# Patient Record
Sex: Female | Born: 1945 | Race: White | Hispanic: No | Marital: Married
Health system: Southern US, Community
[De-identification: ages and names within clinical notes are randomized; demographics above are authoritative.]

---

## 2005-07-29 ENCOUNTER — Ambulatory Visit: Payer: Self-pay | Admitting: Ophthalmology

## 2006-11-25 ENCOUNTER — Ambulatory Visit: Payer: Self-pay | Admitting: Ophthalmology

## 2006-11-25 ENCOUNTER — Other Ambulatory Visit: Payer: Self-pay

## 2006-12-01 ENCOUNTER — Ambulatory Visit: Payer: Self-pay | Admitting: Ophthalmology

## 2008-03-25 ENCOUNTER — Ambulatory Visit: Payer: Self-pay | Admitting: Family Medicine

## 2008-10-03 ENCOUNTER — Ambulatory Visit: Payer: Self-pay | Admitting: Internal Medicine

## 2009-06-16 ENCOUNTER — Ambulatory Visit: Payer: Self-pay | Admitting: Internal Medicine

## 2009-06-26 ENCOUNTER — Ambulatory Visit: Payer: Self-pay | Admitting: Internal Medicine

## 2010-04-10 ENCOUNTER — Ambulatory Visit: Payer: Self-pay | Admitting: Family Medicine

## 2010-10-11 ENCOUNTER — Ambulatory Visit: Payer: Self-pay | Admitting: Family Medicine

## 2012-04-07 ENCOUNTER — Ambulatory Visit: Payer: Self-pay | Admitting: Family Medicine

## 2012-05-05 ENCOUNTER — Ambulatory Visit: Payer: Self-pay | Admitting: Family Medicine

## 2012-05-06 ENCOUNTER — Ambulatory Visit: Payer: Self-pay | Admitting: Orthopedic Surgery

## 2012-05-06 LAB — URINALYSIS, COMPLETE
Bacteria: NONE SEEN
Bilirubin,UR: NEGATIVE
Blood: NEGATIVE
Glucose,UR: >=500 mg/dL (ref 0–75)
Ketone: NEGATIVE
Leukocyte Esterase: NEGATIVE
Nitrite: NEGATIVE
Ph: 5 (ref 4.5–8.0)
Protein: NEGATIVE
Specific Gravity: 1.022 (ref 1.003–1.030)
WBC UR: NONE SEEN /HPF (ref 0–5)

## 2012-05-06 LAB — CBC
HGB: 14.3 g/dL (ref 12.0–16.0)
MCH: 32.5 pg (ref 26.0–34.0)
Platelet: 271 10*3/uL (ref 150–440)
WBC: 7.6 10*3/uL (ref 3.6–11.0)

## 2012-05-06 LAB — BASIC METABOLIC PANEL
Anion Gap: 8 (ref 7–16)
Calcium, Total: 9.6 mg/dL (ref 8.5–10.1)
Chloride: 107 mmol/L (ref 98–107)
Co2: 27 mmol/L (ref 21–32)
Creatinine: 0.63 mg/dL (ref 0.60–1.30)
Osmolality: 283 (ref 275–301)
Sodium: 142 mmol/L (ref 136–145)

## 2012-05-12 ENCOUNTER — Ambulatory Visit: Payer: Self-pay | Admitting: Internal Medicine

## 2012-05-14 ENCOUNTER — Ambulatory Visit: Payer: Self-pay | Admitting: Orthopedic Surgery

## 2012-07-30 ENCOUNTER — Ambulatory Visit: Payer: Self-pay | Admitting: Family Medicine

## 2012-09-01 ENCOUNTER — Ambulatory Visit: Payer: Self-pay | Admitting: Family Medicine

## 2013-05-28 ENCOUNTER — Ambulatory Visit: Payer: Self-pay | Admitting: Unknown Physician Specialty

## 2013-11-12 ENCOUNTER — Ambulatory Visit: Payer: Self-pay | Admitting: Family Medicine

## 2014-08-12 NOTE — Op Note (Signed)
PATIENT NAME:  Samantha Hunt, Samantha Hunt MR#:  [id number] DATE OF BIRTH:  [birth year]  DATE OF PROCEDURE:  05/14/2012  SURGEON: Murlean Hark, MD  PREOPERATIVE DIAGNOSIS:  Left shoulder rotator cuff tear, subacromial bursitis and impingement.   POSTOPERATIVE DIAGNOSIS:  Left shoulder rotator cuff tear, subacromial bursitis and impingement.  PROCEDURES PERFORMED: 1.  Left shoulder arthroscopy.  2.  Capsular release.  3.  Subacromial decompression/bursectomy.  4.  Rotator cuff repair.   ASSISTANT:  April Berndt  ESTIMATED BLOOD LOSS:  Minimal.  ANESTHESIA:  General anesthesia and interscalene block.  FINDINGS:  Arthroscopy reveals that the patient had a rupture of the proximal biceps tendon. The patient had significant scarring of the anterior capsule. The subscapularis tendon was torn, retracted and severely scarred. The patient had a full thickness tear of the supraspinatus and a portion of the infraspinatus. The patient had significant bursitis in the subacromial space.   COMPLICATIONS:  No immediate intraoperative or postoperative complications were noted.   INDICATIONS FOR PROCEDURE:  The patient is a 69 year old female who presented to my office as an outpatient complaining of chronic pain and weakness in her left shoulder that was severely exacerbated in July after she extended her arm in an attempt to catch her mother-in-law. She has had 3 prior rotator cuff repairs on the contralateral shoulder. The patient states that her pain and dysfunction is quite significant and bothersome, particularly with attempts at doing any overhead activity. MRI was reviewed and extensive discussion was had with the patient and her husband regarding the nature of the surgery and the expected course of rehabilitation. The patient was counseled on risks of surgery, as well as nonoperative options. She was also counseled on the expected course of rehabilitation and the expected period of immobilization  postoperatively. The patient showed full understanding.  DESCRIPTION OF PROCEDURE:  The patient was identified in the preoperative holding area. Her left shoulder was marked as the operative site. H and P was updated.  The patient was brought into the operating room and placed on the operating room table in a supine position. An interscalene block had been administered in the preoperative holding area. General anesthesia was administered. The patient was positioned in a beach chair position with adequate padding of all prominences. Her head was secured in good alignment. Her torso and legs were secured.   The left upper extremity was prepared and draped in the usual sterile fashion. Surgical landmarks were demarcated. Preoperative antibiotics were administered. A surgical timeout was performed identifying the patient, the extremity, the procedure, the images, and confirming the administration of preoperative antibiotics.   A standard posterior viewing portal was made.  Arthroscope was inserted. The patient was found to have significant fraying of the labrum. Her biceps tendon was ruptured and out of the viewable portion of the shoulder joint. The joint was scoped for any loose bodies. The subscapularis tendon was not readily visible in the viewing field. An anterior portal was made under direct visualization and a cannula was inserted. A probe was inserted through the cannula and an attempt was made to locate the subscapularis tendon. The tendon was found to be quite retracted and frayed. Some wispy fibers of tendon were noted at the edge of the glenoid. A tissue grasper was inserted and an attempt was made to reapproximate the tendon back to the humerus. The tendon was tightly scarred. An arthroscopic shaver was inserted and the significant scar in the anterior capsule was released. The subscapularis tendon was freed of surrounding  scar and a portion of the MGHL was released. A bit more excursion of the  subscapularis was noted after the release. Despite extensive scar release, the tendon was still unable to be reapproximated to the humerus. The attainable portion of tendon was extremely thin and friable. Given the chronicity of this portion of the rotator cuff tear and the friable and scarred nature of the tissue, the subscapularis tendon was determined to be irreparable. At this time, the remainder of the labral edge was debrided. Attention was turned to the remainder of the rotator cuff. A large tear of the supraspinatus and a portion of the infraspinatus tendon were noted. With the arthroscope in the glenohumeral joint, the tendon edge was carefully debrided through a lateral portal.   At this time, attention was turned to the subacromial space. With a shaver in a lateral portal, an extensive bursectomy was carried out. Using a combination of an ArthroCare and a burr, a slight acromioplasty was carried out. The CA ligament was left intact.   The rotator cuff tear was located. The anterior cannula was moved into the subacromial space. Using an ArthroCare wand, the humeral footprint was cleaned of soft tissue. A burr was then used to expose bleeding bone. At this time, a 5.5 x 16.3 corkscrew FT-2 Arthrex metal anchor was inserted into the anterior portion of the rotator cuff footprint. This was inserted through the lateral portal. Through a similar approach, a second anchor was placed in the posterior aspect of the footprint. All sutures were stored in the anterior cannula. Using a Scorpion, sutures were passed from anterior to posterior direction. The sutures were subsequently tied using sliding knots from posterior to anterior. The rotator cuff nicely approximated the footprint readily covering over the exposed footprint and anchors. The decision was made to place one lateral row anchor. One suture limb from each pair, as well as a second suture limb from one of the anterior pairs was loaded into a 4.75  BioComposite SwiveLock. After the lateral row hole was punched, the SwiveLock was inserted. This nicely sealed down on the lateral aspect of the torn rotator cuff.   All suture limbs were cut. The shoulder was drained of fluid. The cuff was taken through range of motion to 90 degrees of abduction, 80 degrees of external rotation and internal rotation to neutral and the cuff was found to be intact throughout.   Portals were closed using 3-0 nylon suture. Sterile dressings were applied. A Polar Care was applied. The patient's arm was placed in a sling with a bolster pillow.   The patient will be set up with a TENS unit in the postoperative care unit. She will be discharged home the same day as surgery.    ____________________________ Murlean HarkShalini Jaice Lague, MD sr:ce D: 05/14/2012 14:59:00 ET T: 05/14/2012 17:07:13 ET JOB#: [id number]  cc: Murlean HarkShalini Makenzey Nanni, MD, <Dictator> Murlean HarkSHALINI Beauden Tremont MD ELECTRONICALLY SIGNED 05/20/2012 15:22

## 2014-11-21 ENCOUNTER — Other Ambulatory Visit: Payer: Self-pay | Admitting: Family Medicine

## 2014-11-21 DIAGNOSIS — M5416 Radiculopathy, lumbar region: Secondary | ICD-10-CM

## 2014-11-25 ENCOUNTER — Ambulatory Visit
Admission: RE | Admit: 2014-11-25 | Discharge: 2014-11-25 | Disposition: A | Discharge status: Home / Self Care | Payer: Medicare Other | Source: Ambulatory Visit | Attending: Family Medicine | Admitting: Family Medicine

## 2014-11-25 DIAGNOSIS — M2578 Osteophyte, vertebrae: Secondary | ICD-10-CM | POA: Diagnosis not present

## 2014-11-25 DIAGNOSIS — M4806 Spinal stenosis, lumbar region: Secondary | ICD-10-CM | POA: Diagnosis not present

## 2014-11-25 DIAGNOSIS — G544 Lumbosacral root disorders, not elsewhere classified: Secondary | ICD-10-CM | POA: Diagnosis not present

## 2014-11-25 DIAGNOSIS — M5387 Other specified dorsopathies, lumbosacral region: Secondary | ICD-10-CM | POA: Insufficient documentation

## 2014-11-25 DIAGNOSIS — M5386 Other specified dorsopathies, lumbar region: Secondary | ICD-10-CM | POA: Insufficient documentation

## 2014-11-25 DIAGNOSIS — M5125 Other intervertebral disc displacement, thoracolumbar region: Secondary | ICD-10-CM | POA: Diagnosis not present

## 2014-11-25 DIAGNOSIS — M5416 Radiculopathy, lumbar region: Secondary | ICD-10-CM | POA: Diagnosis present

## 2014-11-25 DIAGNOSIS — M5126 Other intervertebral disc displacement, lumbar region: Secondary | ICD-10-CM | POA: Insufficient documentation

## 2016-04-11 ENCOUNTER — Other Ambulatory Visit: Payer: Self-pay | Admitting: Family Medicine

## 2016-04-11 DIAGNOSIS — Z1231 Encounter for screening mammogram for malignant neoplasm of breast: Secondary | ICD-10-CM

## 2016-05-20 ENCOUNTER — Ambulatory Visit
Admission: RE | Admit: 2016-05-20 | Discharge: 2016-05-20 | Disposition: A | Discharge status: Home / Self Care | Payer: Medicare Other | Source: Ambulatory Visit | Attending: Family Medicine | Admitting: Family Medicine

## 2016-05-20 DIAGNOSIS — Z1231 Encounter for screening mammogram for malignant neoplasm of breast: Secondary | ICD-10-CM | POA: Diagnosis not present

## 2017-04-24 ENCOUNTER — Other Ambulatory Visit: Payer: Self-pay | Admitting: Family Medicine

## 2017-04-24 DIAGNOSIS — Z1231 Encounter for screening mammogram for malignant neoplasm of breast: Secondary | ICD-10-CM

## 2017-05-21 ENCOUNTER — Ambulatory Visit
Admission: RE | Admit: 2017-05-21 | Discharge: 2017-05-21 | Disposition: A | Discharge status: Home / Self Care | Payer: Medicare Other | Source: Ambulatory Visit | Attending: Family Medicine | Admitting: Family Medicine

## 2017-05-21 DIAGNOSIS — Z1231 Encounter for screening mammogram for malignant neoplasm of breast: Secondary | ICD-10-CM

## 2018-04-29 ENCOUNTER — Other Ambulatory Visit: Payer: Self-pay | Admitting: Family Medicine

## 2018-04-29 DIAGNOSIS — Z1231 Encounter for screening mammogram for malignant neoplasm of breast: Secondary | ICD-10-CM

## 2018-05-25 ENCOUNTER — Ambulatory Visit
Admission: RE | Admit: 2018-05-25 | Discharge: 2018-05-25 | Disposition: A | Discharge status: Home / Self Care | Payer: Medicare Other | Source: Ambulatory Visit | Attending: Family Medicine | Admitting: Family Medicine

## 2018-05-25 DIAGNOSIS — Z1231 Encounter for screening mammogram for malignant neoplasm of breast: Secondary | ICD-10-CM | POA: Insufficient documentation

## 2018-06-01 ENCOUNTER — Other Ambulatory Visit: Payer: Self-pay | Admitting: Neurology

## 2018-06-01 DIAGNOSIS — M5442 Lumbago with sciatica, left side: Secondary | ICD-10-CM

## 2018-06-09 ENCOUNTER — Ambulatory Visit: Admission: RE | Admit: 2018-06-09 | Payer: Medicare Other | Source: Ambulatory Visit

## 2018-06-24 ENCOUNTER — Other Ambulatory Visit: Payer: Self-pay | Admitting: Family Medicine

## 2018-06-24 DIAGNOSIS — R1310 Dysphagia, unspecified: Secondary | ICD-10-CM

## 2018-06-30 ENCOUNTER — Other Ambulatory Visit: Payer: Self-pay

## 2018-06-30 ENCOUNTER — Ambulatory Visit
Admission: RE | Admit: 2018-06-30 | Discharge: 2018-06-30 | Disposition: A | Discharge status: Home / Self Care | Payer: Medicare Other | Source: Ambulatory Visit | Attending: Neurology | Admitting: Neurology

## 2018-06-30 DIAGNOSIS — M5442 Lumbago with sciatica, left side: Secondary | ICD-10-CM | POA: Insufficient documentation

## 2018-07-02 ENCOUNTER — Ambulatory Visit: Payer: Medicare Other | Attending: Neurology | Admitting: Physical Therapy

## 2018-07-02 ENCOUNTER — Other Ambulatory Visit: Payer: Self-pay

## 2018-07-02 DIAGNOSIS — M79605 Pain in left leg: Secondary | ICD-10-CM | POA: Insufficient documentation

## 2018-07-02 DIAGNOSIS — R29898 Other symptoms and signs involving the musculoskeletal system: Secondary | ICD-10-CM | POA: Diagnosis present

## 2018-07-02 DIAGNOSIS — M545 Low back pain: Secondary | ICD-10-CM | POA: Insufficient documentation

## 2018-07-02 DIAGNOSIS — M6281 Muscle weakness (generalized): Secondary | ICD-10-CM | POA: Diagnosis present

## 2018-07-02 NOTE — Therapy (Signed)
[facility] [address] [place], [place], [address] Phone: [phone]   Fax:  [phone]  Physical Therapy Evaluation  Patient Details  Name: Samantha Hunt MRN: [id number] Date of Birth: 1945-10-02 No data recorded  Encounter Date: 07/02/2018  PT End of Session - 07/02/18 1055    Visit Number  1    Number of Visits  6    Date for PT Re-Evaluation  07/23/18    Authorization Time Period  Eval 07/02/2018    Authorization - Visit Number  1    Authorization - Number of Visits  6    PT Start Time  0806    PT Stop Time  0900    PT Time Calculation (min)  54 min    Activity Tolerance  Patient tolerated treatment well    Behavior During Therapy  [facility] for tasks assessed/performed       No past medical history on file.  No past surgical history on file.  There were no vitals filed for this visit.  Manual Muscle Testing  UE screening: grip/elbow: WFL Shoulder ROM and strength limited due to bilateral rotator cuff tears  Ankles: Bilat: 4+/5 Knees: flexion/extension: L: 4/5 Hips: Flexion: L: 4-/5, R: 4/5          Abduction: Bilat: 4-/5 *some hesitation with MMT of L side not necessarily limiting strength   Special Tests Abdominal strength: supine leg lower: 3+/5 Thomas L: +, R: - Figure 4: L: +, R: - Bridge: 100% range without pain Unable to perform side plank due to shoulder pain  SI joint provocation tests: - for pain  Joint mobilization: T10-L1: WNL L2-sacrum: hypomobile with pain radiating to L buttock     Subjective Assessment - 07/02/18 1615    Subjective  Patient reports chronic low back pain which has caused radiating sensations down bilateral LE's at times, though she mostly experiences this to the L side.  Pt likes to be active and also takes care of her husband, who has dementia.  She has already been performing some stretches and abdominal exercises which has been helping, but she feels the pain has been  getting worse recently.      Pertinent History  Pt has no significant history involving her back but does report bilateral rotator cuff tears which also limit activity.  She cannot pinpoint a significant event which caused the onset of back pain but does state that is has recently worsened when she is trying to take a walk with her husband in their neighborhood.  Pt reports recently being "cautious" with stairs and that she is able to walk 2 mi but walking in conjunction with standing to shop often causes her back pain to increase.    Limitations  Sitting;Standing;Walking    How long can you stand comfortably?  Pain onset occurs when pt stands still    How long can you walk comfortably?  <2 hrs    Diagnostic tests  MRI    Patient Stated Goals  To be able to do yard work and go shopping without back pain; to be able to play with grandchildren and pick them up.    Currently in Pain?  Yes    Pain Score  7     Pain Location  Hip    Pain Orientation  Left    Pain Descriptors / Indicators  Burning;Radiating;Aching    Pain Type  Chronic pain    Pain Radiating Towards  L hip  Pain Onset  More than a month ago    Pain Frequency  Intermittent    Aggravating Factors   Bending, standing still or walking for prolonged periods of time, lifting, reaching overhead, vaccuuming    Pain Relieving Factors  extension, TENS unit, stretching                    Objective measurements completed on examination: See above findings.              PT Education - 07/02/18 1054    Education Details  HEP issed including stretching and TrA strengthening    Person(s) Educated  Patient    Methods  Explanation;Demonstration;Tactile cues;Handout;Verbal cues    Comprehension  Verbalized understanding;Returned demonstration       PT Short Term Goals - 07/02/18 1643      PT SHORT TERM GOAL #1   Title  Patient will verbalize and demonstrate understanding of HEP to begin to manage symptoms at home.     Baseline  HEP issued today    Time  2    Period  Weeks    Status  New    Target Date  07/16/18      PT SHORT TERM GOAL #2   Title  Pt will present with - figure four and thomas test to demonstrate improved flexion    Baseline  Thomas: + L LE, Figure 4: + L LE    Time  2    Period  Weeks    Status  New    Target Date  07/16/18        PT Long Term Goals - 07/02/18 1639      PT LONG TERM GOAL #1   Title  Patient will improve FOTO score to 61 to indicate improved functional activity.    Baseline  57    Time  3    Period  Weeks    Status  New    Target Date  07/23/18      PT LONG TERM GOAL #2   Title  Patient will demonstrate improved LE strength via MMT: L hip flexion 4+/5, bilateral hip abduction: 4+/5, abdominal strength: 4+/5    Baseline  L hip flexion 4-/5, bilateral hip abduction: 4-/5, abdominal strength: 3+/5    Time  3    Period  Weeks    Status  New    Target Date  07/23/18      PT LONG TERM GOAL #3   Title  Patient will demonstrate independence with HEP to manage pain and symptoms at home.    Baseline  New HEP issued today    Time  3    Period  Weeks    Status  New    Target Date  07/23/18             Plan - 07/02/18 1056    Clinical Impression Statement  Pt is a 73 year old female who is generally active and has been experiencing low back pain which radiates bilaterally, more so L than R.  Pt presented with good overall strength, though she does have weakness of abdominals, hip flexors and abductors and some apprehension with movement of L LE at this time.  Pt presented with hypomobility of sacrum and lumbar vertebrae as well as pain reproduction.  She presented with tightness of piriformis and L hip flexor during special testing.  Pt screened for SI joint dysfunction with negative results.  Pt responded well to HEP  exercises and reported a decrease in pain from 7/10 to 5/10 following evaluation and treatment.  Pt will continue to benefit from skilled PT  with focus on LE and abdominal strength, flexibility, spinal mobility, HEP, pain management and tolerance to activity.    Examination-Activity Limitations  Bend;Caring for Others;Sit;Reach Overhead;Stand    Examination-Participation Restrictions  Laundry;Shop;Yard Work    Agricultural consultant  Low    Rehab Potential  Good    PT Frequency  2x / week    PT Duration  3 weeks    PT Treatment/Interventions  ADLs/Self Care Home Management;Moist Heat;Traction;Gait training;Stair training;Functional mobility training;Therapeutic activities;Therapeutic exercise;Balance training;Neuromuscular re-education;Patient/family education;Manual techniques;Taping;Spinal Manipulations;Joint Manipulations    PT Next Visit Plan  Review HEP, core strengthening, flexibility    Consulted and Agree with Plan of Care  Patient       Patient will benefit from skilled therapeutic intervention in order to improve the following deficits and impairments:  Decreased activity tolerance, Decreased endurance, Decreased mobility, Hypomobility, Pain  Visit Diagnosis: Low back pain radiating to left leg  Muscle weakness (generalized)  Impaired flexibility of lower extremity     Problem List There are no active problems to display for this patient.  Glenetta Hew, PT, DPT  Glenetta Hew 07/02/2018, 4:47 PM  [facility] [address] [place], [place], [address] Phone: [phone]   Fax:  [phone]  Name: Samantha Hunt MRN: [id number] Date of Birth: [birth year]

## 2018-07-07 ENCOUNTER — Other Ambulatory Visit: Payer: Self-pay

## 2018-07-07 ENCOUNTER — Ambulatory Visit: Payer: Medicare Other | Admitting: Physical Therapy

## 2018-07-07 DIAGNOSIS — M545 Low back pain: Secondary | ICD-10-CM

## 2018-07-07 DIAGNOSIS — R29898 Other symptoms and signs involving the musculoskeletal system: Secondary | ICD-10-CM

## 2018-07-07 DIAGNOSIS — M6281 Muscle weakness (generalized): Secondary | ICD-10-CM

## 2018-07-07 DIAGNOSIS — M79605 Pain in left leg: Principal | ICD-10-CM

## 2018-07-07 NOTE — Therapy (Signed)
[facility] [address] [place], [place], [address] Phone: [phone]   Fax:  [phone]  Physical Therapy Treatment  Patient Details  Name: Samantha Hunt MRN: [id number] Date of Birth: Jul 11, 1945 No data recorded  Encounter Date: 07/07/2018  PT End of Session - 07/07/18 1247    Visit Number  2    Number of Visits  6    Date for PT Re-Evaluation  07/23/18    Authorization Time Period  Eval 07/02/2018    Authorization - Visit Number  2    Authorization - Number of Visits  6    PT Start Time  0845    PT Stop Time  0930    PT Time Calculation (min)  45 min    Activity Tolerance  Patient tolerated treatment well    Behavior During Therapy  [facility] for tasks assessed/performed       No past medical history on file.  No past surgical history on file.  There were no vitals filed for this visit.  Subjective Assessment - 07/07/18 0850    Subjective  Pt reports that she is still feeling "burning" in her back.  She has been doing her stretches at home and using heat and ice.  She feels that ice is helping more so than heat.    Pertinent History  Pt has no significant history involving her back but does report bilateral rotator cuff tears which also limit activity.  She cannot pinpoint a significant event which caused the onset of back pain but does state that is has recently worsened when she is trying to take a walk with her husband in their neighborhood.  Pt reports recently being "cautious" with stairs and that she is able to walk 2 mi but walking in conjunction with standing to shop often causes her back pain to increase.    Limitations  Sitting;Standing;Walking    How long can you stand comfortably?  Pain onset occurs when pt stands still    How long can you walk comfortably?  <2 hrs    Diagnostic tests  MRI    Patient Stated Goals  To be able to do yard work and go shopping without back pain; to be able to play with grandchildren and  pick them up.    Pain Onset  More than a month ago      supine lumbar rotation with mild overpressure 3x30 sec Figure four 2x40 sec bilaterally with L side being + for mild pain Supine TrA contraction 3 secx10 Supine 90-90 toe taps x20 VC's for deep breathing Bridge/feet on ball x10 each deadlifts 2x10 with 9 lb wts Squats 2x10 with Vc's for form                           PT Education - 07/07/18 1246    Education Details  Review of HEP, stretching and breathing technique    Person(s) Educated  Patient    Methods  Explanation;Demonstration;Tactile cues;Verbal cues    Comprehension  Verbalized understanding;Need further instruction       PT Short Term Goals - 07/02/18 1643      PT SHORT TERM GOAL #1   Title  Patient will verbalize and demonstrate understanding of HEP to begin to manage symptoms at home.    Baseline  HEP issued today    Time  2    Period  Weeks    Status  New  Target Date  07/16/18      PT SHORT TERM GOAL #2   Title  Pt will present with - figure four and thomas test to demonstrate improved flexion    Baseline  Thomas: + L LE, Figure 4: + L LE    Time  2    Period  Weeks    Status  New    Target Date  07/16/18        PT Long Term Goals - 07/02/18 1639      PT LONG TERM GOAL #1   Title  Patient will improve FOTO score to 61 to indicate improved functional activity.    Baseline  57    Time  3    Period  Weeks    Status  New    Target Date  07/23/18      PT LONG TERM GOAL #2   Title  Patient will demonstrate improved LE strength via MMT: L hip flexion 4+/5, bilateral hip abduction: 4+/5, abdominal strength: 4+/5    Baseline  L hip flexion 4-/5, bilateral hip abduction: 4-/5, abdominal strength: 3+/5    Time  3    Period  Weeks    Status  New    Target Date  07/23/18      PT LONG TERM GOAL #3   Title  Patient will demonstrate independence with HEP to manage pain and symptoms at home.    Baseline  New HEP issued today     Time  3    Period  Weeks    Status  New    Target Date  07/23/18            Plan - 07/07/18 1629    Clinical Impression Statement  Pt entered reporting similar pain to last visit but that ice has been helping.  Pt able to tolerate assistance with stretching without pain increase and reported decrease pain and stiffness following.  Pt presented with good form during strengthening exercises and required min VC's to correct, though she did have some very mild balance disturbance on occasion.  She presented with some tightness of HS on R side today without pain during passive stretch.  Pt will continue to benefit from skilled PT with focus on strength, tolerance to activity, pain management and safe functional mobility.      Examination-Activity Limitations  Bend;Caring for Others;Sit;Reach Overhead;Stand    Examination-Participation Restrictions  Laundry;Shop;Yard Work    Conservation officer, historic buildings  Evolving/Moderate complexity    Rehab Potential  Good    PT Frequency  2x / week    PT Duration  3 weeks    PT Treatment/Interventions  ADLs/Self Care Home Management;Moist Heat;Traction;Gait training;Stair training;Functional mobility training;Therapeutic activities;Therapeutic exercise;Balance training;Neuromuscular re-education;Patient/family education;Manual techniques;Taping;Spinal Manipulations;Joint Manipulations    PT Next Visit Plan  Review HEP, core strengthening, flexibility    Consulted and Agree with Plan of Care  Patient       Patient will benefit from skilled therapeutic intervention in order to improve the following deficits and impairments:  Decreased activity tolerance, Decreased endurance, Decreased mobility, Hypomobility, Pain  Visit Diagnosis: Low back pain radiating to left leg  Muscle weakness (generalized)  Impaired flexibility of lower extremity     Problem List There are no active problems to display for this patient.  Glenetta Hew, PT,  DPT  Glenetta Hew 07/07/2018, 4:31 PM  [place] [facility] [address] [place], [place], [address] Phone: [phone]   Fax:  [phone]  Name: Aubrianna Ishaq MRN: [id number] Date of Birth: 1946/02/05

## 2018-07-24 ENCOUNTER — Ambulatory Visit: Payer: Medicare Other

## 2018-08-31 ENCOUNTER — Ambulatory Visit: Payer: Medicare Other | Attending: Neurology

## 2018-08-31 ENCOUNTER — Other Ambulatory Visit: Payer: Self-pay

## 2018-08-31 DIAGNOSIS — M6281 Muscle weakness (generalized): Secondary | ICD-10-CM

## 2018-08-31 DIAGNOSIS — M545 Low back pain, unspecified: Secondary | ICD-10-CM

## 2018-08-31 DIAGNOSIS — M79605 Pain in left leg: Secondary | ICD-10-CM | POA: Diagnosis present

## 2018-08-31 DIAGNOSIS — R29898 Other symptoms and signs involving the musculoskeletal system: Secondary | ICD-10-CM | POA: Diagnosis present

## 2018-09-02 ENCOUNTER — Other Ambulatory Visit: Payer: Self-pay

## 2018-09-02 ENCOUNTER — Ambulatory Visit: Payer: Medicare Other

## 2018-09-02 ENCOUNTER — Encounter: Payer: Self-pay | Admitting: Physical Therapy

## 2018-09-02 DIAGNOSIS — M79605 Pain in left leg: Secondary | ICD-10-CM

## 2018-09-02 DIAGNOSIS — M545 Low back pain, unspecified: Secondary | ICD-10-CM

## 2018-09-02 DIAGNOSIS — M6281 Muscle weakness (generalized): Secondary | ICD-10-CM

## 2018-09-02 NOTE — Therapy (Signed)
[facility] [address] [place], [place], [address] Phone: [phone]   Fax:  [phone]  Physical Therapy Treatment  Patient Details  Name: Samantha Hunt MRN: [id number] Date of Birth: [birth year] No data recorded  Encounter Date: 09/02/2018  PT End of Session - 09/02/18 0908    Visit Number  4    Number of Visits  15    Date for PT Re-Evaluation  09/28/18    Authorization Time Period  Eval 07/02/2018    PT Start Time  0900    PT Stop Time  0950    PT Time Calculation (min)  50 min    Activity Tolerance  Patient tolerated treatment well    Behavior During Therapy  [facility] for tasks assessed/performed       History reviewed. No pertinent past medical history.  History reviewed. No pertinent surgical history.  There were no vitals filed for this visit.  Subjective Assessment - 09/02/18 0907    Subjective  Pt reports that she is having some low back stiffness upon arrival today. Denies pain but reports stiffness of 8/10. She was not excessively sore after her last appointment. No specific questions or concerns at this time.     Pertinent History  Pt has no significant history involving her back but does report bilateral rotator cuff tears which also limit activity.  She cannot pinpoint a significant event which caused the onset of back pain but does state that is has recently worsened when she is trying to take a walk with her husband in their neighborhood.  Pt reports recently being "cautious" with stairs and that she is able to walk 2 mi but walking in conjunction with standing to shop often causes her back pain to increase.    Limitations  Sitting;Standing;Walking    How long can you stand comfortably?  Pain onset occurs when pt stands still    How long can you walk comfortably?  <2 hrs    Diagnostic tests  MRI    Patient Stated Goals  To be able to do yard work and go shopping without back pain; to be able to play with  grandchildren and pick them up.    Currently in Pain?  Yes    Pain Score  8     Pain Location  Back    Pain Orientation  Left    Pain Descriptors / Indicators  Tightness    Pain Type  Chronic pain    Pain Onset  More than a month ago          TREATMENT   Manual Therapy  Hooklying lumbar rotationrocking within pain-free range for warm-up; Figure four stretch 30s hold x 2 bilateral; Cross-body piriformis stretch 30s hold x 2 bilateral; Single knee to chest stretch 30s hold x 2 bilateral; HS stretch with sciatic nerve glides 30s x 2 bilateral; Hooklying lumbar rotational stretch with overpressure by therapist 30s x 2 bilateral; Long axis L hip/lumbar distraction with belt assist, grade II, 30s/bout x 3 bouts;   Ther-ex  SciFit L4 x 10 minutes for warm-up (unbilled); Hooklying ant/post pelvic tilts 3s hold 2 x 10 each direction; Hooklying marches with coordinated inhale/exhale for TrA contraction alternating 2 x 10 bilateral; Hooklying bridge with 3s hold  with coordinated inhale/exhale for TrA contraction x 10;   Pt educated throughout session about proper posture and technique with exercises. Improved exercise technique, movement at target joints, use of target muscles after min to mod  verbal, visual, tactile cues.     Pt is able to complete all exercises as instructed today. Updated goals with patient due to EMR being down during last appointment. She demonstrates improved hip abduction strength today but hip flexion and abdominal strength is unchanged. She reports decreased "tightness" in her low back at end of session but does not rate. Pt encouraged to continue her HEP. Will continue to progress lumbar mobility and abdominal/low back strength. Pt will benefit from PT services to address deficits in strength and pain in order to return to full function at home.                         PT Short Term Goals - 09/02/18 0944      PT SHORT TERM GOAL #1    Title  Patient will verbalize and demonstrate understanding of HEP to begin to manage symptoms at home.    Baseline  HEP issued today    Time  2    Period  Weeks    Status  On-going    Target Date  09/14/18      PT SHORT TERM GOAL #2   Title  Pt will present with - figure four and thomas test to demonstrate improved flexion    Baseline  Thomas: + L LE, Figure 4: + L LE; 09/02/18: Negative figure 4, Thomas: negative for tissue length restrictions, positive for left low back pain;    Time  2    Period  Weeks    Status  Partially Met    Target Date  09/14/18        PT Long Term Goals - 09/02/18 0947      PT LONG TERM GOAL #1   Title  Patient will improve FOTO score to 61 to indicate improved functional activity.    Baseline  57    Time  4    Period  Weeks    Status  Deferred    Target Date  09/28/18      PT LONG TERM GOAL #2   Title  Patient will demonstrate improved LE strength via MMT: L hip flexion 4+/5, bilateral hip abduction: 4+/5, abdominal strength: 4+/5    Baseline  L hip flexion 4-/5, bilateral hip abduction: 4-/5, abdominal strength: 3+/5; 09/02/18: L hip flexion 4-/5, bilateral hip abduction: 4/5, abdominal strength: 3+/5;    Time  4    Period  Weeks    Status  Deferred      PT LONG TERM GOAL #3   Title  Patient will demonstrate independence with HEP to manage pain and symptoms at home.    Baseline  New HEP issued today    Time  4    Period  Weeks    Status  On-going    Target Date  09/28/18            Plan - 09/02/18 0909    Clinical Impression Statement  Pt is able to complete all exercises as instructed today. Updated goals with patient due to EMR being down during last appointment. She demonstrates improved hip abduction strength today but hip flexion and abdominal strength is unchanged. She reports decreased "tightness" in her low back at end of session but does not rate. Pt encouraged to continue her HEP. Will continue to progress lumbar mobility and  abdominal/low back strength. Pt will benefit from PT services to address deficits in strength and pain in order to return to full function  at home.     Examination-Activity Limitations  Bend;Caring for Others;Sit;Reach Overhead;Stand    Examination-Participation Restrictions  Laundry;Shop;Yard Work    [employer]chant [employer] officer  Evolving/Moderate complexity    Rehab Potential  Good    PT Frequency  2x / week    PT Duration  3 weeks    PT Treatment/Interventions  ADLs/Self Care Home Management;Moist Heat;Traction;Gait training;Stair training;Functional mobility training;Therapeutic activities;Therapeutic exercise;Balance training;Neuromuscular re-education;Patient/family education;Manual techniques;Taping;Spinal Manipulations;Joint Manipulations    PT Next Visit Plan  Review HEP, core strengthening, flexibility    Consulted and Agree with Plan of Care  Patient       Patient will benefit from skilled therapeutic intervention in order to improve the following deficits and impairments:  Decreased activity tolerance, Decreased endurance, Decreased mobility, Hypomobility, Pain  Visit Diagnosis: Low back pain radiating to left leg  Muscle weakness (generalized)     Problem List There are no active problems to display for this patient.  Lyndel Safe Kyelle Urbas PT, DPT, GCS  Bertran Zeimet 09/02/2018, 11:09 AM  [facility] [address]. [place], [place], [address] Phone: [phone]   Fax:  [phone]  Name: Samantha Hunt MRN: [id number] Date of Birth: [birth year]

## 2018-09-02 NOTE — Therapy (Signed)
[facility] [address] [place], [place], [address] Phone: [phone]   Fax:  [phone]  Physical Therapy Treatment/Recertification  Patient Details  Name: Samantha Hunt MRN: [id number] Date of Birth: Aug 22, 1945 No data recorded  Encounter Date: 08/31/2018  PT End of Session - 09/02/18 0848    Visit Number  3    Number of Visits  15    Date for PT Re-Evaluation  09/28/18    Authorization Time Period  Eval 07/02/2018    PT Start Time  1315    PT Stop Time  1400    PT Time Calculation (min)  45 min    Activity Tolerance  Patient tolerated treatment well    Behavior During Therapy  [facility] for tasks assessed/performed       History reviewed. No pertinent past medical history.  History reviewed. No pertinent surgical history.  There were no vitals filed for this visit.  Subjective Assessment - 09/02/18 0847    Subjective  Pt reports that she is doing alright today but her back is still hurting. She complains of 6/10 low back pain upon arrival. No specific questions upon arrival.     Pertinent History  Pt has no significant history involving her back but does report bilateral rotator cuff tears which also limit activity.  She cannot pinpoint a significant event which caused the onset of back pain but does state that is has recently worsened when she is trying to take a walk with her husband in their neighborhood.  Pt reports recently being "cautious" with stairs and that she is able to walk 2 mi but walking in conjunction with standing to shop often causes her back pain to increase.    Limitations  Sitting;Standing;Walking    How long can you stand comfortably?  Pain onset occurs when pt stands still    How long can you walk comfortably?  <2 hrs    Diagnostic tests  MRI    Patient Stated Goals  To be able to do yard work and go shopping without back pain; to be able to play with grandchildren and pick them up.    Currently in  Pain?  Yes    Pain Score  6     Pain Location  Back    Pain Orientation  Left    Pain Descriptors / Indicators  Burning;Aching;Radiating    Pain Type  Chronic pain    Pain Onset  More than a month ago         TREATMENT   Manual Therapy  Hooklying lumbar rotation rocking within pain-free range for warm-up; Figure four stretch 30s hold x 2 bilateral; Cross-body piriformis stretch on L 30s hold x 2; L single knee to chest stretch 30s hold x 2; Lumbar rotational mobilizations grade I-II 30s/bout x 3 bouts bilateral; Prone CPA and L UPA grade II, 30s/bout x 3 bouts L3-L5;   Ther-ex  Hooklying ant/post pelvic tilts 3s hold 2 x 10 each direction; Hooklying bridge with 3s hold x 10;   Pt educated throughout session about proper posture and technique with exercises. Improved exercise technique, movement at target joints, use of target muscles after min to mod verbal, visual, tactile cues.     Pt is able to complete all exercises as instructed today. Epic EMR system is down during visit so unable to update goals with patient but will complete at next session. Pt encouraged to continue her HEP. Will continue to progress lumbar  mobility and abdominal/low back strength. Pt will benefit from PT services to address deficits in strength and pain in order to return to full function at home.                       PT Short Term Goals - 09/02/18 0850      PT SHORT TERM GOAL #1   Title  Patient will verbalize and demonstrate understanding of HEP to begin to manage symptoms at home.    Baseline  HEP issued today    Time  2    Period  Weeks    Status  On-going    Target Date  09/14/18      PT SHORT TERM GOAL #2   Title  Pt will present with - figure four and thomas test to demonstrate improved flexion    Baseline  Thomas: + L LE, Figure 4: + L LE    Time  2    Period  Weeks    Status  Deferred    Target Date  09/14/18        PT Long Term Goals - 09/02/18 0853       PT LONG TERM GOAL #1   Title  Patient will improve FOTO score to 61 to indicate improved functional activity.    Baseline  57    Time  4    Period  Weeks    Status  Deferred    Target Date  09/30/18      PT LONG TERM GOAL #2   Title  Patient will demonstrate improved LE strength via MMT: L hip flexion 4+/5, bilateral hip abduction: 4+/5, abdominal strength: 4+/5    Baseline  L hip flexion 4-/5, bilateral hip abduction: 4-/5, abdominal strength: 3+/5    Time  4    Period  Weeks    Status  Deferred    Target Date  09/28/18      PT LONG TERM GOAL #3   Title  Patient will demonstrate independence with HEP to manage pain and symptoms at home.    Baseline  New HEP issued today    Time  4    Period  Weeks    Status  On-going            Plan - 09/02/18 0849    Clinical Impression Statement  Pt is able to complete all exercises as instructed today. Epic EMR system is down during visit so unable to update goals with patient but will complete at next session. Pt encouraged to continue her HEP. Will continue to progress lumbar mobility and abdominal/low back strength. Pt will benefit from PT services to address deficits in strength and pain in order to return to full function at home.     Examination-Activity Limitations  Bend;Caring for Others;Sit;Reach Overhead;Stand    Examination-Participation Restrictions  Laundry;Shop;Yard Work    Conservation officer, historic buildings  Evolving/Moderate complexity    Rehab Potential  Good    PT Frequency  2x / week    PT Duration  3 weeks    PT Treatment/Interventions  ADLs/Self Care Home Management;Moist Heat;Traction;Gait training;Stair training;Functional mobility training;Therapeutic activities;Therapeutic exercise;Balance training;Neuromuscular re-education;Patient/family education;Manual techniques;Taping;Spinal Manipulations;Joint Manipulations    PT Next Visit Plan  Review HEP, core strengthening, flexibility    Consulted and Agree with Plan of  Care  Patient       Patient will benefit from skilled therapeutic intervention in order to improve the following deficits and impairments:  Decreased  activity tolerance, Decreased endurance, Decreased mobility, Hypomobility, Pain  Visit Diagnosis: Low back pain radiating to left leg  Muscle weakness (generalized)     Problem List There are no active problems to display for this patient.  Lynnea Maizes PT, DPT, GCS  Huprich,Jason 09/02/2018, 9:04 AM  [facility] MAIN [facility] SERVICES [address] [place], [place], [address] Phone: [phone]   Fax:  [phone]  Name: Samantha Hunt MRN: [id number] Date of Birth: 02-10-1946

## 2018-09-07 ENCOUNTER — Other Ambulatory Visit: Payer: Self-pay

## 2018-09-07 ENCOUNTER — Encounter: Payer: Self-pay | Admitting: Physical Therapy

## 2018-09-07 ENCOUNTER — Ambulatory Visit: Payer: Medicare Other

## 2018-09-07 DIAGNOSIS — M545 Low back pain: Secondary | ICD-10-CM

## 2018-09-07 DIAGNOSIS — M6281 Muscle weakness (generalized): Secondary | ICD-10-CM

## 2018-09-07 DIAGNOSIS — M79605 Pain in left leg: Secondary | ICD-10-CM

## 2018-09-07 NOTE — Therapy (Signed)
[facility] [address] [place], [place], [address] Phone: [phone]   Fax:  [phone]  Physical Therapy Treatment  Patient Details  Name: Samantha Hunt MRN: [id number] Date of Birth: Oct 31, 1945 No data recorded  Encounter Date: 09/07/2018  PT End of Session - 09/07/18 1406    Visit Number  5    Number of Visits  15    Date for PT Re-Evaluation  09/28/18    Authorization Time Period  Eval 07/02/2018    PT Start Time  1025    PT Stop Time  1448    PT Time Calculation (min)  45 min    Activity Tolerance  Patient tolerated treatment well    Behavior During Therapy  [facility] for tasks assessed/performed       History reviewed. No pertinent past medical history.  History reviewed. No pertinent surgical history.  There were no vitals filed for this visit.  Subjective Assessment - 09/07/18 1402    Subjective  Pt reports that she is having some low back stiffness upon arrival today. She rates it as 6/10 upon arrival. Pt reports that she just finished strawberry picking which did aggravate her back slightly. Overall she feels like her back pain is improving. No specific questions or concerns at this time.    Pertinent History  Pt has no significant history involving her back but does report bilateral rotator cuff tears which also limit activity.  She cannot pinpoint a significant event which caused the onset of back pain but does state that is has recently worsened when she is trying to take a walk with her husband in their neighborhood.  Pt reports recently being "cautious" with stairs and that she is able to walk 2 mi but walking in conjunction with standing to shop often causes her back pain to increase.    Limitations  Sitting;Standing;Walking    How long can you stand comfortably?  Pain onset occurs when pt stands still    How long can you walk comfortably?  <2 hrs    Diagnostic tests  MRI    Patient Stated Goals  To be able to do  yard work and go shopping without back pain; to be able to play with grandchildren and pick them up.    Currently in Pain?  Yes    Pain Score  6     Pain Location  Back    Pain Orientation  Left    Pain Descriptors / Indicators  Tightness    Pain Type  Chronic pain    Pain Onset  More than a month ago    Pain Frequency  Intermittent          TREATMENT   Manual Therapy Hooklyinglumbar rotationrocking within pain-free range for warm-up; Figure fourstretch 30s hold x 2 bilateral; Cross-body piriformis stretch 30s hold x 2 bilateral; Single knee to chest stretch 30s hold x 2 bilateral; HS stretch with sciatic nerve glides 30s x 2 bilateral; Long axis L hip/lumbar distraction with belt assist, grade II, 30s/bout x 3 bouts; Prone central posterior to anterior mobilizations grade I-II, L3-L5, 30s/bout x 1 bout each; Prone L unilateral posterior to anterior mobilizations grade I-II, L3-L5, 30s/bout x 1 bout each;   Ther-ex NuStep warm-up during history (3 minutes unbilled); Hooklying ant/post pelvic tilts 3s hold 2 x 10 each direction; Hooklying marches with coordinated inhale/exhale for TrA contraction alternating 2 x 10 bilateral; Hooklying bridge with 3s hold  with coordinated inhale/exhale for  TrA contraction x 10; Hooklying clams with manual resistance x 10; Hooklying adductor squeeze with manual resistance x 10;   Pt educated throughout session about proper posture and technique with exercises. Improved exercise technique, movement at target joints, use of target muscles after min to mod verbal, visual, tactile cues.   Pt is able to complete all exercises as instructed today. She reports that overall she feels like her pain is improving. She denies any improvement in tightness at end of session. Pt continues to reinforce her position of comfort is lumbar extension. Pt encouraged to continue her HEP. Will continue to progress lumbar mobility and abdominal/low back  strength.Pt will benefit from PT services to address deficits in strengthand painin order to return to full function at home.                       PT Short Term Goals - 09/02/18 0944      PT SHORT TERM GOAL #1   Title  Patient will verbalize and demonstrate understanding of HEP to begin to manage symptoms at home.    Baseline  HEP issued today    Time  2    Period  Weeks    Status  On-going    Target Date  09/14/18      PT SHORT TERM GOAL #2   Title  Pt will present with - figure four and thomas test to demonstrate improved flexion    Baseline  Thomas: + L LE, Figure 4: + L LE; 09/02/18: Negative figure 4, Thomas: negative for tissue length restrictions, positive for left low back pain;    Time  2    Period  Weeks    Status  Partially Met    Target Date  09/14/18        PT Long Term Goals - 09/02/18 0947      PT LONG TERM GOAL #1   Title  Patient will improve FOTO score to 61 to indicate improved functional activity.    Baseline  57    Time  4    Period  Weeks    Status  Deferred    Target Date  09/28/18      PT LONG TERM GOAL #2   Title  Patient will demonstrate improved LE strength via MMT: L hip flexion 4+/5, bilateral hip abduction: 4+/5, abdominal strength: 4+/5    Baseline  L hip flexion 4-/5, bilateral hip abduction: 4-/5, abdominal strength: 3+/5; 09/02/18: L hip flexion 4-/5, bilateral hip abduction: 4/5, abdominal strength: 3+/5;    Time  4    Period  Weeks    Status  Deferred      PT LONG TERM GOAL #3   Title  Patient will demonstrate independence with HEP to manage pain and symptoms at home.    Baseline  New HEP issued today    Time  4    Period  Weeks    Status  On-going    Target Date  09/28/18            Plan - 09/07/18 1407    Clinical Impression Statement  Pt is able to complete all exercises as instructed today. She reports that overall she feels like her pain is improving. She denies any improvement in tightness at end  of session. Pt continues to reinforce her position of comfort is lumbar extension. Pt encouraged to continue her HEP. Will continue to progress lumbar mobility and abdominal/low back strength.Pt will benefit  from PT services to address deficits in strengthand painin order to return to full function at home.    Examination-Activity Limitations  Bend;Caring for Others;Sit;Reach Overhead;Stand    Examination-Participation Restrictions  Laundry;Shop;Yard Work    Merchant [employer] officer  Evolving/Moderate complexity    Rehab Potential  Good    PT Frequency  2x / week    PT Duration  3 weeks    PT Treatment/Interventions  ADLs/Self Care Home Management;Moist Heat;Traction;Gait training;Stair training;Functional mobility training;Therapeutic activities;Therapeutic exercise;Balance training;Neuromuscular re-education;Patient/family education;Manual techniques;Taping;Spinal Manipulations;Joint Manipulations    PT Next Visit Plan  Review HEP, core strengthening, flexibility    Consulted and Agree with Plan of Care  Patient       Patient will benefit from skilled therapeutic intervention in order to improve the following deficits and impairments:  Decreased activity tolerance, Decreased endurance, Decreased mobility, Hypomobility, Pain  Visit Diagnosis: Low back pain radiating to left leg  Muscle weakness (generalized)     Problem List There are no active problems to display for this patient.  Lyndel Safe Mattis Featherly PT, DPT, GCS  Mareena Cavan 09/07/2018, 2:48 PM  [place] [address] [place], [place], [address] Phone: [phone]   Fax:  [phone]  Name: Iyauna Sing MRN: [id number] Date of Birth: May 15, 1945

## 2018-09-09 ENCOUNTER — Other Ambulatory Visit: Payer: Self-pay

## 2018-09-09 ENCOUNTER — Ambulatory Visit: Payer: Medicare Other

## 2018-09-09 ENCOUNTER — Encounter: Payer: Self-pay | Admitting: Physical Therapy

## 2018-09-09 DIAGNOSIS — M545 Low back pain, unspecified: Secondary | ICD-10-CM

## 2018-09-09 DIAGNOSIS — M6281 Muscle weakness (generalized): Secondary | ICD-10-CM

## 2018-09-09 NOTE — Therapy (Signed)
[facility] [address] [place], [place], [address] Phone: [phone]   Fax:  [phone]  Physical Therapy Treatment  Patient Details  Name: Samantha Hunt MRN: [id number] Date of Birth: 1945/10/30 No data recorded  Encounter Date: 09/09/2018  PT End of Session - 09/09/18 0906    Visit Number  6    Number of Visits  15    Date for PT Re-Evaluation  09/28/18    Authorization Time Period  Eval 07/02/2018    PT Start Time  0858    PT Stop Time  0945    PT Time Calculation (min)  47 min    Activity Tolerance  Patient tolerated treatment well    Behavior During Therapy  [facility] for tasks assessed/performed       History reviewed. No pertinent past medical history.  History reviewed. No pertinent surgical history.  There were no vitals filed for this visit.  Subjective Assessment - 09/09/18 0905    Subjective  Pt reports that she is having some low back stiffness upon arrival today. She rates it as 6/10 upon arrival. No specific questions or concerns at this time.    Pertinent History  Pt has no significant history involving her back but does report bilateral rotator cuff tears which also limit activity.  She cannot pinpoint a significant event which caused the onset of back pain but does state that is has recently worsened when she is trying to take a walk with her husband in their neighborhood.  Pt reports recently being "cautious" with stairs and that she is able to walk 2 mi but walking in conjunction with standing to shop often causes her back pain to increase.    Limitations  Sitting;Standing;Walking    How long can you stand comfortably?  Pain onset occurs when pt stands still    How long can you walk comfortably?  <2 hrs    Diagnostic tests  MRI    Patient Stated Goals  To be able to do yard work and go shopping without back pain; to be able to play with grandchildren and pick them up.    Currently in Pain?  Yes    Pain Score  6      Pain Location  Back    Pain Orientation  Left    Pain Descriptors / Indicators  Tightness    Pain Type  Chronic pain    Pain Onset  More than a month ago          TREATMENT   Manual Therapy Hooklyinglumbar rotationrocking within pain-free range for warm-up; L figure fourstretch 30s hold x 2 bilateral; L cross-body piriformis stretch 30s hold x 2bilateral; L single knee to chest stretch 30s hold x 2bilateral; L HS stretch with sciatic nerve glides x 30s;   Ther-ex NuStep warm-up during history (3 minutes unbilled); Hooklying ant/post pelvic tilts 3s hold 2 x 10 each direction; Hooklying marches with coordinated inhale/exhale for TrA contraction alternating 2 x 10 bilateral; Hooklying bridge with 3s holdwith coordinated inhale/exhale for TrA contraction 2 x 10; Hooklying pball press down for abdominal strength 3s hold 2 x 10; Hooklying resisted lumbar rotation for oblique activation with therapist providing resistance at knees 3s hold 2 x 10 each direction; Hooklying clams with red tband resistance 2 x 10; Hooklying adductor ball squeeze 3s hold x 10; Prone alternating hip extension 2 x 10 bilateral;   Pt educated throughout session about proper posture and technique with exercises.  Improved exercise technique, movement at target joints, use of target muscles after min to mod verbal, visual, tactile cues.   Pt is able to complete all exercises as instructed today. Focused session more on strengthening as she reports poor carryover from session to session with respect to pain relief. Therapist attempting to empower pt with more exercises she can complete independently at home. Written HEP furnished to patient today. Pt encouraged to continue her HEP. Will continue to progress lumbar mobility and abdominal/low back strength.Pt will benefit from PT services to address deficits in strengthand painin order to return to full function at  home.                       PT Short Term Goals - 09/02/18 0944      PT SHORT TERM GOAL #1   Title  Patient will verbalize and demonstrate understanding of HEP to begin to manage symptoms at home.    Baseline  HEP issued today    Time  2    Period  Weeks    Status  On-going    Target Date  09/14/18      PT SHORT TERM GOAL #2   Title  Pt will present with - figure four and thomas test to demonstrate improved flexion    Baseline  Thomas: + L LE, Figure 4: + L LE; 09/02/18: Negative figure 4, Thomas: negative for tissue length restrictions, positive for left low back pain;    Time  2    Period  Weeks    Status  Partially Met    Target Date  09/14/18        PT Long Term Goals - 09/02/18 0947      PT LONG TERM GOAL #1   Title  Patient will improve FOTO score to 61 to indicate improved functional activity.    Baseline  57    Time  4    Period  Weeks    Status  Deferred    Target Date  09/28/18      PT LONG TERM GOAL #2   Title  Patient will demonstrate improved LE strength via MMT: L hip flexion 4+/5, bilateral hip abduction: 4+/5, abdominal strength: 4+/5    Baseline  L hip flexion 4-/5, bilateral hip abduction: 4-/5, abdominal strength: 3+/5; 09/02/18: L hip flexion 4-/5, bilateral hip abduction: 4/5, abdominal strength: 3+/5;    Time  4    Period  Weeks    Status  Deferred      PT LONG TERM GOAL #3   Title  Patient will demonstrate independence with HEP to manage pain and symptoms at home.    Baseline  New HEP issued today    Time  4    Period  Weeks    Status  On-going    Target Date  09/28/18            Plan - 09/09/18 0906    Clinical Impression Statement  Pt is able to complete all exercises as instructed today. Focused session more on strengthening as she reports poor carryover from session to session with respect to pain relief. Therapist attempting to empower pt with more exercises she can complete independently at home. Written HEP  furnished to patient today. Pt encouraged to continue her HEP. Will continue to progress lumbar mobility and abdominal/low back strength.Pt will benefit from PT services to address deficits in strengthand painin order to return to full function at home.  Examination-Activity Limitations  Bend;Caring for Others;Sit;Reach Overhead;Stand    Examination-Participation Restrictions  Laundry;Shop;Yard Work    [employer] officer  Evolving/Moderate complexity    Rehab Potential  Good    PT Frequency  2x / week    PT Duration  3 weeks    PT Treatment/Interventions  ADLs/Self Care Home Management;Moist Heat;Traction;Gait training;Stair training;Functional mobility training;Therapeutic activities;Therapeutic exercise;Balance training;Neuromuscular re-education;Patient/family education;Manual techniques;Taping;Spinal Manipulations;Joint Manipulations    PT Next Visit Plan  Review HEP, core strengthening, flexibility    PT Home Exercise Plan  Medbridge: [email or web address]     Consulted and Agree with Plan of Care  Patient       Patient will benefit from skilled therapeutic intervention in order to improve the following deficits and impairments:  Decreased activity tolerance, Decreased endurance, Decreased mobility, Hypomobility, Pain  Visit Diagnosis: Low back pain radiating to left leg  Muscle weakness (generalized)     Problem List There are no active problems to display for this patient.  Phillips Grout PT, DPT, GCS  Arlow Spiers 09/09/2018, 10:35 AM  [place] [facility] [address] [place], [place], [address] Phone: [phone]   Fax:  [phone]  Name: Maribell Demeo MRN: [id number] Date of Birth: 05/31/1945

## 2018-09-09 NOTE — Patient Instructions (Signed)
Access Code: [email or web address]  URL: [email or web address]  Date: 09/09/2018  Prepared by: Ria Comment   Exercises  Supine Figure 4 Piriformis Stretch - 3 reps - 30 seconds hold - 1x daily - 7x weekly  Supine Piriformis Stretch with Foot on Ground - 3 reps - 30 seconds hold - 1x daily - 7x weekly  Supine Pelvic Tilt - 10 reps - 2 sets - 1x daily - 7x weekly  Supine Bridge - 10 reps - 2 sets - 1x daily - 7x weekly  Supine March - 10 reps - 2 sets - 3 seconds hold - 1x daily - 7x weekly

## 2018-09-15 ENCOUNTER — Ambulatory Visit: Payer: Medicare Other | Admitting: Physical Therapy

## 2018-09-17 ENCOUNTER — Ambulatory Visit: Payer: Medicare Other | Admitting: Physical Therapy

## 2018-09-17 ENCOUNTER — Other Ambulatory Visit: Payer: Self-pay

## 2018-09-17 ENCOUNTER — Encounter: Payer: Self-pay | Admitting: Physical Therapy

## 2018-09-17 DIAGNOSIS — M6281 Muscle weakness (generalized): Secondary | ICD-10-CM

## 2018-09-17 DIAGNOSIS — M79605 Pain in left leg: Secondary | ICD-10-CM

## 2018-09-17 DIAGNOSIS — R29898 Other symptoms and signs involving the musculoskeletal system: Secondary | ICD-10-CM

## 2018-09-17 DIAGNOSIS — M545 Low back pain: Secondary | ICD-10-CM

## 2018-09-17 NOTE — Therapy (Signed)
[facility] [address] [place], [place], [address] Phone: [phone]   Fax:  [phone]  Physical Therapy Treatment  Patient Details  Name: Samantha Hunt MRN: [id number] Date of Birth: 03/02/1946 No data recorded  Encounter Date: 09/17/2018  PT End of Session - 09/17/18 0902    Visit Number  7    Number of Visits  15    Date for PT Re-Evaluation  09/28/18    Authorization Time Period  Eval 07/02/2018    PT Start Time  0902    PT Stop Time  0943    PT Time Calculation (min)  41 min    Activity Tolerance  Patient tolerated treatment well    Behavior During Therapy  [facility] for tasks assessed/performed       History reviewed. No pertinent past medical history.  History reviewed. No pertinent surgical history.  There were no vitals filed for this visit.  Subjective Assessment - 09/17/18 0904    Subjective  Patient states that she is "pretty good" but still has some lumbar tightness with radiation into the legs. Patient reports feeling like she "pays for it" when she is picking up sticks in the yard.    Pertinent History  Pt has no significant history involving her back but does report bilateral rotator cuff tears which also limit activity.  She cannot pinpoint a significant event which caused the onset of back pain but does state that is has recently worsened when she is trying to take a walk with her husband in their neighborhood.  Pt reports recently being "cautious" with stairs and that she is able to walk 2 mi but walking in conjunction with standing to shop often causes her back pain to increase.    Limitations  Sitting;Standing;Walking    How long can you stand comfortably?  Pain onset occurs when pt stands still    How long can you walk comfortably?  <2 hrs    Diagnostic tests  MRI    Patient Stated Goals  To be able to do yard work and go shopping without back pain; to be able to play with grandchildren and pick them up.    Currently in Pain?  Yes    Pain Score  6     Pain Location  Back    Pain Orientation  Left    Pain Descriptors / Indicators  Tightness    Pain Type  Chronic pain    Pain Onset  More than a month ago    Pain Frequency  Intermittent      TREATMENT  Manual Therapy Hooklyinglumbar rotationrocking within pain-free range for improved spinal mobility; figure fourstretch 30s hold x 2 bilateral; cross-body piriformis stretch 30s hold x 2bilateral; single knee to chest stretch 30s hold x 2bilateral; HS stretch with sciatic nerve glides x 30s;   Ther-ex NuStep warm-up x 5 min for endogenous pain relief; Hooklying ant/post pelvic tilts 3s hold 2 x 10 each direction; Hooklying marches with coordinated inhale/exhale for TrA contraction alternating 2 x 10 bilateral; Hooklying bridge with 3s holdwith coordinated inhale/exhale for TrA contraction 2 x 10; Hooklying pball press down for abdominal strength 3s hold 2 x 10; Hooklying clams with red tband resistance 2 x 10; Hooklying adductor ball squeeze 3s hold x 10; Standing hip extension 2 x 10 bilateral;    Patient educated throughout session on appropriate technique and form using multi-modal cueing, HEP, and activity modification. Patient articulated understanding and returned demonstration.  Patient Response to interventions: Patient denied any increase in pain and reported improved confidence with bending over to pick things up in the yard.  ASSESSMENT Patient presents to clinic with excellent motivation to participate in therapy. Patient demonstrates deficits in spinal mobility, activity tolerance, and trunk strength as evidenced by increased rotational accessory motion with unilateral extremity movement, fear of bending forward. Patient able to achieve isolated hip extension without lumbar extension compensation during today's session and responded positively to active interventions. Patient will benefit from continued skilled  therapeutic intervention to address remaining deficits in spinal mobility, activity tolerance, and trunk strength in order to return to full participation in social activities, increase function, and improve overall QOL.         PT Short Term Goals - 09/02/18 0944      PT SHORT TERM GOAL #1   Title  Patient will verbalize and demonstrate understanding of HEP to begin to manage symptoms at home.    Baseline  HEP issued today    Time  2    Period  Weeks    Status  On-going    Target Date  09/14/18      PT SHORT TERM GOAL #2   Title  Pt will present with - figure four and thomas test to demonstrate improved flexion    Baseline  Thomas: + L LE, Figure 4: + L LE; 09/02/18: Negative figure 4, Thomas: negative for tissue length restrictions, positive for left low back pain;    Time  2    Period  Weeks    Status  Partially Met    Target Date  09/14/18        PT Long Term Goals - 09/02/18 0947      PT LONG TERM GOAL #1   Title  Patient will improve FOTO score to 61 to indicate improved functional activity.    Baseline  57    Time  4    Period  Weeks    Status  Deferred    Target Date  09/28/18      PT LONG TERM GOAL #2   Title  Patient will demonstrate improved LE strength via MMT: L hip flexion 4+/5, bilateral hip abduction: 4+/5, abdominal strength: 4+/5    Baseline  L hip flexion 4-/5, bilateral hip abduction: 4-/5, abdominal strength: 3+/5; 09/02/18: L hip flexion 4-/5, bilateral hip abduction: 4/5, abdominal strength: 3+/5;    Time  4    Period  Weeks    Status  Deferred      PT LONG TERM GOAL #3   Title  Patient will demonstrate independence with HEP to manage pain and symptoms at home.    Baseline  New HEP issued today    Time  4    Period  Weeks    Status  On-going    Target Date  09/28/18            Plan - 09/17/18 0906    Clinical Impression Statement  Patient presents to clinic with excellent motivation to participate in therapy. Patient demonstrates  deficits in spinal mobility, activity tolerance, and trunk strength as evidenced by increased rotational accessory motion with unilateral extremity movement, fear of bending forward. Patient able to achieve isolated hip extension without lumbar extension compensation during today's session and responded positively to active interventions. Patient will benefit from continued skilled therapeutic intervention to address remaining deficits in spinal mobility, activity tolerance, and trunk strength in order to return to full participation in  social activities, increase function, and improve overall QOL.    Examination-Activity Limitations  Bend;Caring for Others;Sit;Reach Overhead;Stand    Examination-Participation Restrictions  Laundry;Shop;Yard Work    [employer] officer  Evolving/Moderate complexity    Rehab Potential  Good    PT Frequency  2x / week    PT Duration  3 weeks    PT Treatment/Interventions  ADLs/Self Care Home Management;Moist Heat;Traction;Gait training;Stair training;Functional mobility training;Therapeutic activities;Therapeutic exercise;Balance training;Neuromuscular re-education;Patient/family education;Manual techniques;Taping;Spinal Manipulations;Joint Manipulations    PT Next Visit Plan  Review HEP, core strengthening, flexibility    PT Home Exercise Plan  Medbridge: [email or web address]     Consulted and Agree with Plan of Care  Patient       Patient will benefit from skilled therapeutic intervention in order to improve the following deficits and impairments:  Decreased activity tolerance, Decreased endurance, Decreased mobility, Hypomobility, Pain  Visit Diagnosis: Low back pain radiating to left leg  Muscle weakness (generalized)  Impaired flexibility of lower extremity     Problem List There are no active problems to display for this patient.  Myles Gip PT, DPT [phone] 09/17/2018, 5:02 PM  [place] [facility] [address] [place], [place], [address] Phone: [phone]   Fax:  [phone]  Name: Samantha Hunt MRN: [id number] Date of Birth: Dec 08, 1945

## 2018-09-21 ENCOUNTER — Ambulatory Visit: Payer: Medicare Other | Admitting: Physical Therapy

## 2018-09-23 ENCOUNTER — Encounter: Payer: Self-pay | Admitting: Physical Therapy

## 2018-09-23 ENCOUNTER — Ambulatory Visit: Payer: Medicare Other | Attending: Neurology | Admitting: Physical Therapy

## 2018-09-23 ENCOUNTER — Other Ambulatory Visit: Payer: Self-pay

## 2018-09-23 DIAGNOSIS — M79605 Pain in left leg: Secondary | ICD-10-CM | POA: Diagnosis present

## 2018-09-23 DIAGNOSIS — R29898 Other symptoms and signs involving the musculoskeletal system: Secondary | ICD-10-CM | POA: Diagnosis present

## 2018-09-23 DIAGNOSIS — M545 Low back pain, unspecified: Secondary | ICD-10-CM

## 2018-09-23 DIAGNOSIS — M6281 Muscle weakness (generalized): Secondary | ICD-10-CM | POA: Diagnosis present

## 2018-09-23 NOTE — Therapy (Signed)
[facility] [address] [place], [place], [address] Phone: [phone]   Fax:  [phone]  Physical Therapy Treatment  Patient Details  Name: Samantha Hunt MRN: [id number] Date of Birth: 1945-09-08 No data recorded  Encounter Date: 09/23/2018  PT End of Session - 09/23/18 1254    Visit Number  8    Number of Visits  15    Date for PT Re-Evaluation  09/28/18    Authorization Time Period  Eval 07/02/2018    PT Start Time  1300    PT Stop Time  1340    PT Time Calculation (min)  40 min    Activity Tolerance  Patient tolerated treatment well    Behavior During Therapy  [facility] for tasks assessed/performed       History reviewed. No pertinent past medical history.  History reviewed. No pertinent surgical history.  There were no vitals filed for this visit.  Subjective Assessment - 09/23/18 1259    Subjective  Patient states that she is about the same and that it helps when she exercises.     Pertinent History  Pt has no significant history involving her back but does report bilateral rotator cuff tears which also limit activity.  She cannot pinpoint a significant event which caused the onset of back pain but does state that is has recently worsened when she is trying to take a walk with her husband in their neighborhood.  Pt reports recently being "cautious" with stairs and that she is able to walk 2 mi but walking in conjunction with standing to shop often causes her back pain to increase.    Limitations  Sitting;Standing;Walking    How long can you stand comfortably?  Pain onset occurs when pt stands still    How long can you walk comfortably?  <2 hrs    Diagnostic tests  MRI    Patient Stated Goals  To be able to do yard work and go shopping without back pain; to be able to play with grandchildren and pick them up.    Pain Onset  More than a month ago       TREATMENT  Pre-treatment assessment: L hip hike, R PSIS slightly  posterior. RLE measured 77.5 cm, LLE 75 cm  Manual Therapy: LLE, long axis hip distraction with oscillations for improved joint nutrition in the hip, SIJ, and lumbar spine x5 min Sidelying, L QL assisted lengthening with inferior iliaccrest mobilization and superior 45 sec bouts x3 L sacral border mobilizations 30 sec bouts x 3  Neuromuscular Re-education: Sidelying QL stretch R Sidelying, open book L thoracic rotation for improved lengthening of L side body for balance posture Seated L QL sidebody stretch, 10 sec hold x10 Seated R oblique strengthening with RTB counterforce on L for improved lengthening of L and balanced posture  Post-treatment assessment: L hip hike  Patient educated throughout session on appropriate technique and form using multi-modal cueing, HEP, and activity modification. Patient articulated understanding and returned demonstration.  Patient Response to interventions: Patient receptive to new exercises and willing to attempt despite little confidence in physical therapy as a useful intervention.  ASSESSMENT Patient presents to clinic with excellent motivation to participate in therapy. Patient demonstrates deficits in posture, strength, pain, and lumbopelvic control as evidenced by L hip hike, R lumbar curve in spine, difficulty engaging R obliques and QL, and persistent moderate level of pain on NPRS. Patient able to achieve appropriate form for sidelying thoracic rotations during  today's session and responded positively to postural interventions. Patient will benefit from continued skilled therapeutic intervention to address remaining deficits in posture, strength, pain, and lumbopelvic control in order to increase pain free function and improve overall QOL.      PT Short Term Goals - 09/02/18 0944      PT SHORT TERM GOAL #1   Title  Patient will verbalize and demonstrate understanding of HEP to begin to manage symptoms at home.    Baseline  HEP issued today     Time  2    Period  Weeks    Status  On-going    Target Date  09/14/18      PT SHORT TERM GOAL #2   Title  Pt will present with - figure four and thomas test to demonstrate improved flexion    Baseline  Thomas: + L LE, Figure 4: + L LE; 09/02/18: Negative figure 4, Thomas: negative for tissue length restrictions, positive for left low back pain;    Time  2    Period  Weeks    Status  Partially Met    Target Date  09/14/18        PT Long Term Goals - 09/02/18 0947      PT LONG TERM GOAL #1   Title  Patient will improve FOTO score to 61 to indicate improved functional activity.    Baseline  57    Time  4    Period  Weeks    Status  Deferred    Target Date  09/28/18      PT LONG TERM GOAL #2   Title  Patient will demonstrate improved LE strength via MMT: L hip flexion 4+/5, bilateral hip abduction: 4+/5, abdominal strength: 4+/5    Baseline  L hip flexion 4-/5, bilateral hip abduction: 4-/5, abdominal strength: 3+/5; 09/02/18: L hip flexion 4-/5, bilateral hip abduction: 4/5, abdominal strength: 3+/5;    Time  4    Period  Weeks    Status  Deferred      PT LONG TERM GOAL #3   Title  Patient will demonstrate independence with HEP to manage pain and symptoms at home.    Baseline  New HEP issued today    Time  4    Period  Weeks    Status  On-going    Target Date  09/28/18            Plan - 09/23/18 1601    Clinical Impression Statement  Patient presents to clinic with excellent motivation to participate in therapy. Patient demonstrates deficits in posture, strength, pain, and lumbopelvic control as evidenced by L hip hike, R lumbar curve in spine, difficulty engaging R obliques and QL, and persistent moderate level of pain on NPRS. Patient able to achieve appropriate form for sidelying thoracic rotations during today's session and responded positively to postural interventions. Patient will benefit from continued skilled therapeutic intervention to address remaining  deficits in posture, strength, pain, and lumbopelvic control in order to increase pain free function and improve overall QOL.    Examination-Activity Limitations  Bend;Caring for Others;Sit;Reach Overhead;Stand    Examination-Participation Restrictions  Laundry;Shop;Yard Work    [employer] officer  Evolving/Moderate complexity    Rehab Potential  Good    PT Frequency  2x / week    PT Duration  3 weeks    PT Treatment/Interventions  ADLs/Self Care Home Management;Moist Heat;Traction;Gait training;Stair training;Functional mobility training;Therapeutic activities;Therapeutic exercise;Balance training;Neuromuscular re-education;Patient/family education;Manual techniques;Taping;Spinal Manipulations;Joint Manipulations  PT Next Visit Plan  Review HEP, core strengthening, flexibility    PT Home Exercise Plan  Medbridge: [email or web address]     Consulted and Agree with Plan of Care  Patient       Patient will benefit from skilled therapeutic intervention in order to improve the following deficits and impairments:  Decreased activity tolerance, Decreased endurance, Decreased mobility, Hypomobility, Pain  Visit Diagnosis: Low back pain radiating to left leg  Muscle weakness (generalized)  Impaired flexibility of lower extremity     Problem List There are no active problems to display for this patient.  Myles Gip PT, DPT [phone] 09/23/2018, 4:01 PM  [place] [facility] [address] [place], [place], [address] Phone: [phone]   Fax:  [phone]  Name: Samantha Hunt MRN: [id number] Date of Birth: [birth year]

## 2018-09-28 ENCOUNTER — Encounter: Payer: Self-pay | Admitting: Physical Therapy

## 2018-09-28 ENCOUNTER — Other Ambulatory Visit: Payer: Self-pay

## 2018-09-28 ENCOUNTER — Ambulatory Visit: Payer: Medicare Other | Admitting: Physical Therapy

## 2018-09-28 DIAGNOSIS — M545 Low back pain, unspecified: Secondary | ICD-10-CM

## 2018-09-28 DIAGNOSIS — R29898 Other symptoms and signs involving the musculoskeletal system: Secondary | ICD-10-CM

## 2018-09-28 DIAGNOSIS — M6281 Muscle weakness (generalized): Secondary | ICD-10-CM

## 2018-09-28 NOTE — Therapy (Signed)
[facility] [address] [place], [place], [address] Phone: [phone]   Fax:  [phone]  Physical Therapy Treatment/Discharge Summary  Patient Details  Name: Samantha Hunt MRN: [id number] Date of Birth: 1946-01-02 No data recorded  Encounter Date: 09/28/2018  PT End of Session - 09/28/18 1255    Visit Number  9    Number of Visits  15    Date for PT Re-Evaluation  09/28/18    Authorization Time Period  Eval 07/02/2018    PT Start Time  1257    PT Stop Time  1337    PT Time Calculation (min)  40 min    Activity Tolerance  Patient tolerated treatment well    Behavior During Therapy  [facility] for tasks assessed/performed       History reviewed. No pertinent past medical history.  History reviewed. No pertinent surgical history.  There were no vitals filed for this visit.  Subjective Assessment - 09/28/18 1258    Subjective  Patient states that she is about the same and continues to have pain in the legs and back. She finds stretching the most helpful.    Pertinent History  Pt has no significant history involving her back but does report bilateral rotator cuff tears which also limit activity.  She cannot pinpoint a significant event which caused the onset of back pain but does state that is has recently worsened when she is trying to take a walk with her husband in their neighborhood.  Pt reports recently being "cautious" with stairs and that she is able to walk 2 mi but walking in conjunction with standing to shop often causes her back pain to increase.    Limitations  Sitting;Standing;Walking    How long can you stand comfortably?  Pain onset occurs when pt stands still    How long can you walk comfortably?  <2 hrs    Diagnostic tests  MRI    Patient Stated Goals  To be able to do yard work and go shopping without back pain; to be able to play with grandchildren and pick them up.    Currently in Pain?  Yes    Pain Onset  More than a  month ago         TREATMENT  Neuromuscular Re-education: Reviewed HEP.  Glute med strengthening B x30 for improved lumbopelvic position and coordination to decreased pain with walking. Patient educated on multiple variables that impact pain intensity and pain experience; patient provided with information on sleep hygiene to improve reduction in inflammation and recovery for decreased pain experience.   Patient educated throughout session on appropriate technique and form using multi-modal cueing, HEP, and activity modification. Patient articulated understanding and returned demonstration.  Patient Response to interventions: Patient denied any increase in pain during activity; patient states that she feels she has gotten enough information to self-manage.  ASSESSMENT Patient presents to clinic with moderate motivation to participate in therapy. Patient demonstrates deficits in function and continued moderate pain intensity that does not respond as evidenced by patient report and FOTO score of 54 (from 57 on evaluation). Patient able to achieve multiple goals during her course of therapeutic intervention including: (-) Thomas Test, (-) Figure 4 test, increased muscle strength in LE and responded positively to active and educational interventions. While PT encouraged patient and discussed the benefits of continued skilled therapeutic intervention to address remaining deficits in function and pain in order to improve overall QOL, patient feels she has  gained enough information and skills to self-manage Patient encouraged to reach back out to clinic if she has any questions/concerns or needs further therapeutic intervention.     PT Short Term Goals - 09/28/18 1302      PT SHORT TERM GOAL #1   Title  Patient will verbalize and demonstrate understanding of HEP to begin to manage symptoms at home.    Baseline  HEP issued today    Time  2    Period  Weeks    Status  Achieved    Target Date   09/14/18      PT SHORT TERM GOAL #2   Title  Pt will present with - figure four and thomas test to demonstrate improved flexion    Baseline  Thomas: + L LE, Figure 4: + L LE; 09/02/18: Negative figure 4, Thomas: negative for tissue length restrictions, positive for left low back pain;; 09/28/2018: (-) thomas and (-) figure 4    Time  2    Period  Weeks    Status  Achieved    Target Date  09/14/18        PT Long Term Goals - 09/28/18 1318      PT LONG TERM GOAL #1   Title  Patient will improve FOTO score to 61 to indicate improved functional activity.    Baseline  57; 09/28/2018: 54    Time  4    Period  Weeks    Status  Deferred    Target Date  09/28/18      PT LONG TERM GOAL #2   Title  Patient will demonstrate improved LE strength via MMT: L hip flexion 4+/5, bilateral hip abduction: 4+/5, abdominal strength: 4+/5    Baseline  L hip flexion 4-/5, bilateral hip abduction: 4-/5, abdominal strength: 3+/5; 09/02/18: L hip flexion 4-/5, bilateral hip abduction: 4/5, abdominal strength: 3+/5; 09/28/2018: L hip flexion 4+/5, hip aBd R 4/5, L4+/5, abdominal strength: 4/5    Time  4    Period  Weeks    Status  Partially Met    Target Date  09/28/18      PT LONG TERM GOAL #3   Title  Patient will demonstrate independence with HEP to manage pain and symptoms at home.    Baseline  New HEP issued today; 09/28/2018: patient demonstrates and articulates HEP with fluency    Time  4    Period  Weeks    Status  Achieved    Target Date  09/28/18            Plan - 09/28/18 1322    Clinical Impression Statement  Patient presents to clinic with moderate motivation to participate in therapy. Patient demonstrates deficits in function and continued moderate pain intensity that does not respond as evidenced by patient report and FOTO score of 54 (from 57 on evaluation). Patient able to achieve multiple goals during her course of therapeutic intervention including: (-) Thomas Test, (-) Figure 4 test,  increased muscle strength in LE and responded positively to active and educational interventions. While PT encouraged patient and discussed the benefits of continued skilled therapeutic intervention to address remaining deficits in function and pain in order to improve overall QOL, patient feels she has gained enough information and skills to self-manage Patient encouraged to reach back out to clinic if she has any questions/concerns or needs further therapeutic intervention.    Examination-Activity Limitations  Bend;Caring for Others;Sit;Reach Overhead;Stand    Examination-Participation Restrictions  Laundry;Shop;Yard Work  Stability/Clinical Decision Making  Evolving/Moderate complexity    Rehab Potential  Good    PT Frequency  2x / week    PT Duration  3 weeks    PT Treatment/Interventions  ADLs/Self Care Home Management;Moist Heat;Traction;Gait training;Stair training;Functional mobility training;Therapeutic activities;Therapeutic exercise;Balance training;Neuromuscular re-education;Patient/family education;Manual techniques;Taping;Spinal Manipulations;Joint Manipulations    PT Next Visit Plan  Review HEP, core strengthening, flexibility    PT Home Exercise Plan  Medbridge: [email or web address]     Consulted and Agree with Plan of Care  Patient       Patient will benefit from skilled therapeutic intervention in order to improve the following deficits and impairments:  Decreased activity tolerance, Decreased endurance, Decreased mobility, Hypomobility, Pain  Visit Diagnosis: Low back pain radiating to left leg  Muscle weakness (generalized)  Impaired flexibility of lower extremity     Problem List There are no active problems to display for this patient.  Myles Gip PT, DPT [phone] 09/28/2018, 2:25 PM  [facility] [address] [place], [place], [address] Phone: [phone]   Fax:  [phone]  Name: Samantha Hunt MRN: [id number] Date of  Birth: 06-22-1945

## 2018-09-30 ENCOUNTER — Ambulatory Visit
Admission: RE | Admit: 2018-09-30 | Discharge: 2018-09-30 | Disposition: A | Discharge status: Home / Self Care | Payer: Medicare Other | Source: Ambulatory Visit | Attending: Family Medicine | Admitting: Family Medicine

## 2018-09-30 ENCOUNTER — Other Ambulatory Visit: Payer: Self-pay

## 2018-09-30 DIAGNOSIS — R1310 Dysphagia, unspecified: Secondary | ICD-10-CM

## 2018-09-30 NOTE — Therapy (Signed)
[facility] [address] [place], [place], [address] Phone: [phone]   Fax:     Modified Barium Swallow  Patient Details  Name: Samantha AustriaCarolyn Hunt MRN: [id number] Date of Birth: March 07, 1946 No data recorded  Encounter Date: 09/30/2018  End of Session - 09/30/18 1456    Visit Number  1    Number of Visits  1    Date for SLP Re-Evaluation  09/30/18    SLP Start Time  1300    SLP Stop Time   1346    SLP Time Calculation (min)  46 min    Activity Tolerance  Patient tolerated treatment well       No past medical history on file.  No past surgical history on file.  There were no vitals filed for this visit.   Subjective: Patient behavior: (alertness, ability to follow instructions, etc.): The patient is able to verbalize her swallowing history and follow directions.  Chief complaint: Patent reports difficulty swallowing solids.   Objective:  Radiological Procedure: A videoflouroscopic evaluation of oral-preparatory, reflex initiation, and pharyngeal phases of the swallow was performed; as well as a screening of the upper esophageal phase.  I. POSTURE: Upright in MBS chair  II. VIEW: Lateral  III. COMPENSATORY STRATEGIES: N/A  IV. BOLUSES ADMINISTERED:   Thin Liquid: 1 small, 3 rapid consecutive   Nectar-thick Liquid: 1 moderate   Honey-thick Liquid: DNT   Puree: 2 teaspoon presentations   Mechanical Soft: 1/4 graham cracker in applesauce  V. RESULTS OF EVALUATION: A. ORAL PREPARATORY PHASE: (The lips, tongue, and velum are observed for strength and coordination)       **Overall Severity Rating: within normal limits   B. SWALLOW INITIATION/REFLEX: (The reflex is normal if "triggered" by the time the bolus reached the base of the tongue)  **Overall Severity Rating: within normal limits   C. PHARYNGEAL PHASE: (Pharyngeal function is normal if the bolus shows rapid, smooth, and continuous transit through  the pharynx and there is no pharyngeal residue after the swallow)  **Overall Severity Rating: within normal limits   D. LARYNGEAL PENETRATION: (Material entering into the laryngeal inlet/vestibule but not aspirated) TRANSIENT penetration   E. ASPIRATION: NONE  F. ESOPHAGEAL PHASE: (Screening of the upper esophagus) In the cervical esophagus there is a finger-like protrusion along the posterior wall during swallow (does not impede flow of boluses) consistent with prominent cricopharyngeus.  ASSESSMENT: This 73 year old woman; with difficulty swallowing solids; is presenting with functional oropharyngeal swallowing.  Oral control of the bolus including oral hold, rotary mastication, and anterior to posterior transfer is within functional limits. Timing of the pharyngeal swallow initiation is within normal limits.  Aspects of the pharyngeal stage of swallowing including tongue base retraction, hyolaryngeal excursion, epiglottic inversion, and duration/amplitude of UES opening are within normal limits.  There is was transient laryngeal penetration, which is within normal limits for her age, without tracheal aspiration.  The patient is not at significant risk for prandial aspiration.  In the cervical esophagus there is a finger-like protrusion along the posterior wall during swallow (does not impede flow of boluses) consistent with prominent cricopharyngeus.  The patient was counseled that her swallowing is safe from a aspiration point of view.  PLAN/RECOMMENDATIONS:   A. Diet: Regular diet   B. Swallowing Precautions: No special precautions    C. Recommended consultation to: GI, symptoms are consistent with esophageal dysphagia   D. Therapy recommendations: Speech therapy is NOT indicated   E. Results and  recommendations were discussed with the patient immediately following the study and the final report routed to the referring MD.     Dysphagia, unspecified type - Plan: DG SWALLOW FUNC OP  MEDICARE SPEECH PATH, DG SWALLOW FUNC OP MEDICARE SPEECH PATH        Problem List There are no active problems to display for this patient.  Leroy Sea, MS/CCC- SLP  Lou Miner 09/30/2018, 2:57 PM  [facility] [place], [place], [address] Phone: [phone]   Fax:     Name: Samantha Hunt MRN: [id number] Date of Birth: 03-08-1946

## 2018-10-05 ENCOUNTER — Ambulatory Visit: Payer: Medicare Other | Admitting: Physical Therapy

## 2018-10-12 ENCOUNTER — Encounter: Payer: Medicare Other | Admitting: Physical Therapy

## 2018-10-19 ENCOUNTER — Encounter: Payer: Medicare Other | Admitting: Physical Therapy

## 2019-07-06 ENCOUNTER — Other Ambulatory Visit: Payer: Self-pay | Admitting: Physician Assistant

## 2019-07-06 DIAGNOSIS — R1310 Dysphagia, unspecified: Secondary | ICD-10-CM

## 2019-07-09 ENCOUNTER — Ambulatory Visit: Payer: Medicare Other

## 2019-07-28 ENCOUNTER — Other Ambulatory Visit: Payer: Self-pay | Admitting: Neurology

## 2019-07-28 DIAGNOSIS — R29898 Other symptoms and signs involving the musculoskeletal system: Secondary | ICD-10-CM

## 2019-07-28 DIAGNOSIS — Z8249 Family history of ischemic heart disease and other diseases of the circulatory system: Secondary | ICD-10-CM

## 2019-07-28 DIAGNOSIS — H93A2 Pulsatile tinnitus, left ear: Secondary | ICD-10-CM

## 2019-08-04 ENCOUNTER — Other Ambulatory Visit: Payer: Self-pay | Admitting: Family Medicine

## 2019-08-04 DIAGNOSIS — Z1231 Encounter for screening mammogram for malignant neoplasm of breast: Secondary | ICD-10-CM

## 2019-08-09 ENCOUNTER — Ambulatory Visit
Admission: RE | Admit: 2019-08-09 | Discharge: 2019-08-09 | Disposition: A | Discharge status: Home / Self Care | Payer: Medicare Other | Source: Ambulatory Visit | Attending: Family Medicine | Admitting: Family Medicine

## 2019-08-09 DIAGNOSIS — Z1231 Encounter for screening mammogram for malignant neoplasm of breast: Secondary | ICD-10-CM | POA: Insufficient documentation

## 2019-08-11 ENCOUNTER — Ambulatory Visit
Admission: RE | Admit: 2019-08-11 | Discharge: 2019-08-11 | Disposition: A | Discharge status: Home / Self Care | Payer: Medicare Other | Source: Ambulatory Visit | Attending: Neurology | Admitting: Neurology

## 2019-08-11 ENCOUNTER — Other Ambulatory Visit: Payer: Self-pay

## 2019-08-11 DIAGNOSIS — R29898 Other symptoms and signs involving the musculoskeletal system: Secondary | ICD-10-CM | POA: Diagnosis not present

## 2019-08-11 DIAGNOSIS — H93A2 Pulsatile tinnitus, left ear: Secondary | ICD-10-CM | POA: Diagnosis present

## 2019-08-11 DIAGNOSIS — Z8249 Family history of ischemic heart disease and other diseases of the circulatory system: Secondary | ICD-10-CM

## 2020-06-21 ENCOUNTER — Other Ambulatory Visit: Payer: Self-pay | Admitting: Neurology

## 2020-06-21 DIAGNOSIS — G8929 Other chronic pain: Secondary | ICD-10-CM

## 2020-06-26 ENCOUNTER — Ambulatory Visit
Admission: RE | Admit: 2020-06-26 | Discharge: 2020-06-26 | Disposition: A | Discharge status: Home / Self Care | Payer: Medicare Other | Source: Ambulatory Visit | Attending: Neurology | Admitting: Neurology

## 2020-06-26 ENCOUNTER — Other Ambulatory Visit: Payer: Self-pay

## 2020-06-26 DIAGNOSIS — M5441 Lumbago with sciatica, right side: Secondary | ICD-10-CM | POA: Diagnosis present

## 2020-06-26 DIAGNOSIS — M5442 Lumbago with sciatica, left side: Secondary | ICD-10-CM | POA: Insufficient documentation

## 2020-06-26 DIAGNOSIS — G8929 Other chronic pain: Secondary | ICD-10-CM | POA: Insufficient documentation

## 2020-07-27 ENCOUNTER — Other Ambulatory Visit: Payer: Self-pay | Admitting: Family Medicine

## 2020-07-27 DIAGNOSIS — Z1231 Encounter for screening mammogram for malignant neoplasm of breast: Secondary | ICD-10-CM

## 2020-08-11 ENCOUNTER — Ambulatory Visit
Admission: RE | Admit: 2020-08-11 | Discharge: 2020-08-11 | Disposition: A | Discharge status: Home / Self Care | Payer: Medicare Other | Source: Ambulatory Visit | Attending: Family Medicine | Admitting: Family Medicine

## 2020-08-11 ENCOUNTER — Other Ambulatory Visit: Payer: Self-pay

## 2020-08-11 DIAGNOSIS — Z1231 Encounter for screening mammogram for malignant neoplasm of breast: Secondary | ICD-10-CM | POA: Diagnosis present

## 2021-05-03 IMAGING — MG MM DIGITAL SCREENING BILAT W/ TOMO AND CAD
6 of 10 series · 6 of 30 positions shown · non-contrast
Comparison: Previous exam(s).

CLINICAL DATA: Screening.

EXAM:
DIGITAL SCREENING BILATERAL MAMMOGRAM WITH TOMOSYNTHESIS AND CAD
TECHNIQUE: Bilateral screening digital craniocaudal and mediolateral oblique
mammograms were obtained. Bilateral screening digital breast
tomosynthesis was performed. The images were evaluated with
computer-aided detection.

[R MLO synth-2D]
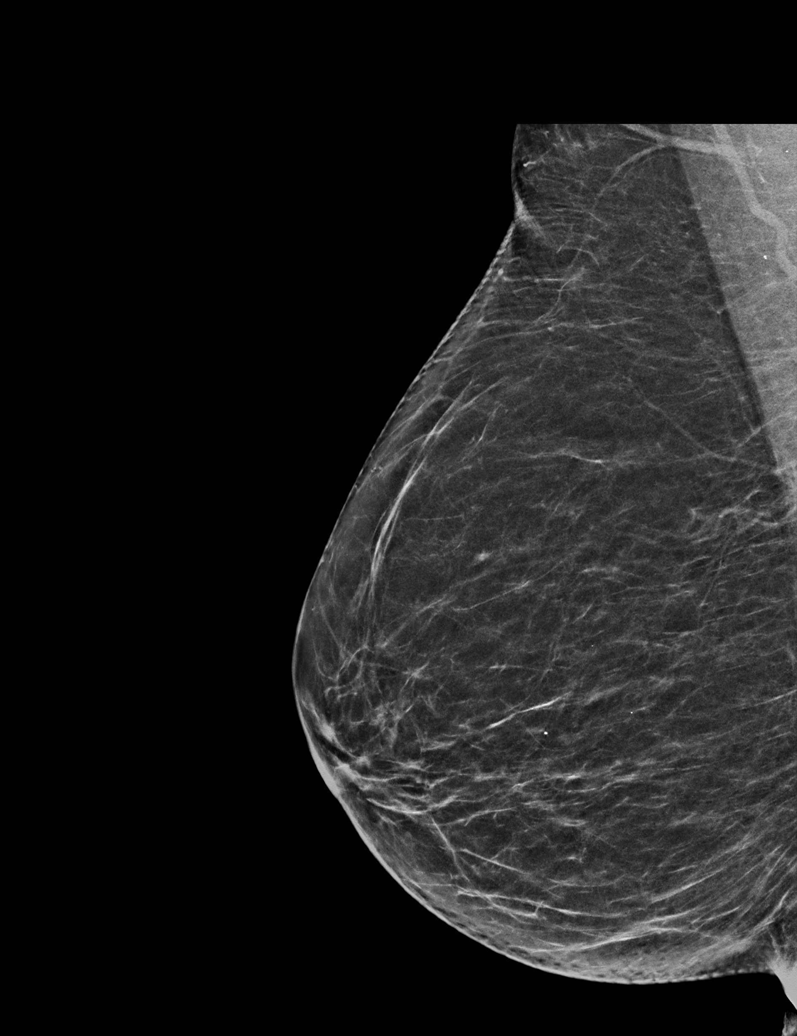

[L MLO synth-2D (1 of 2)]
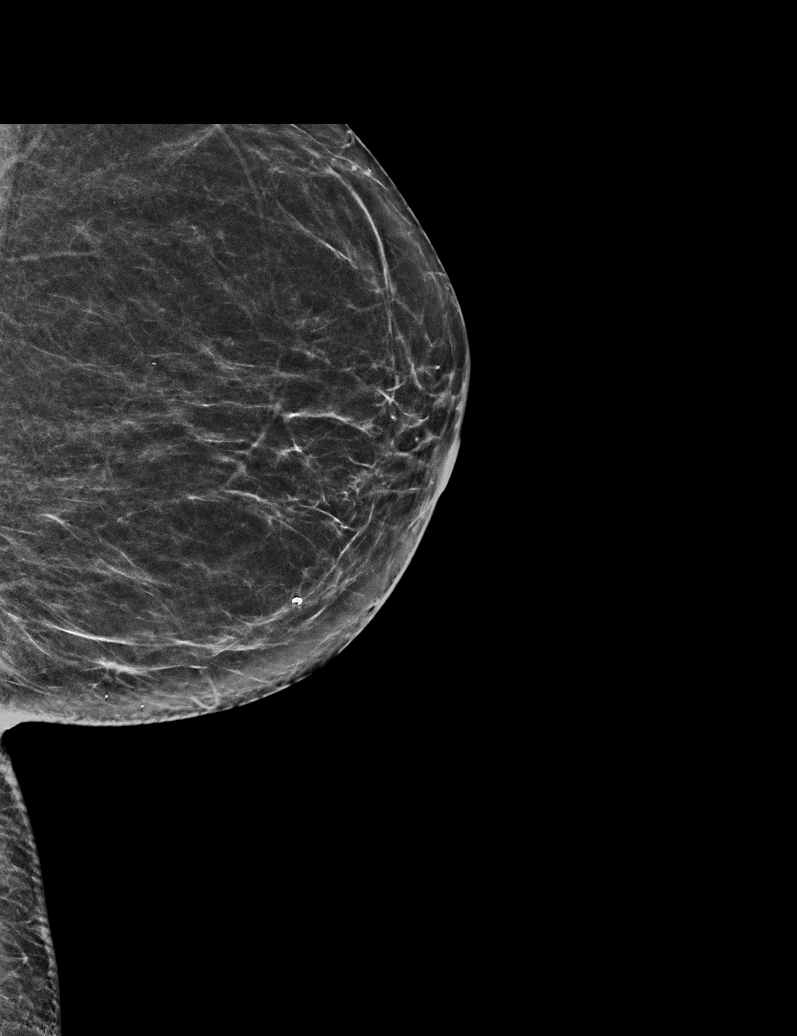

[R CC synth-2D]
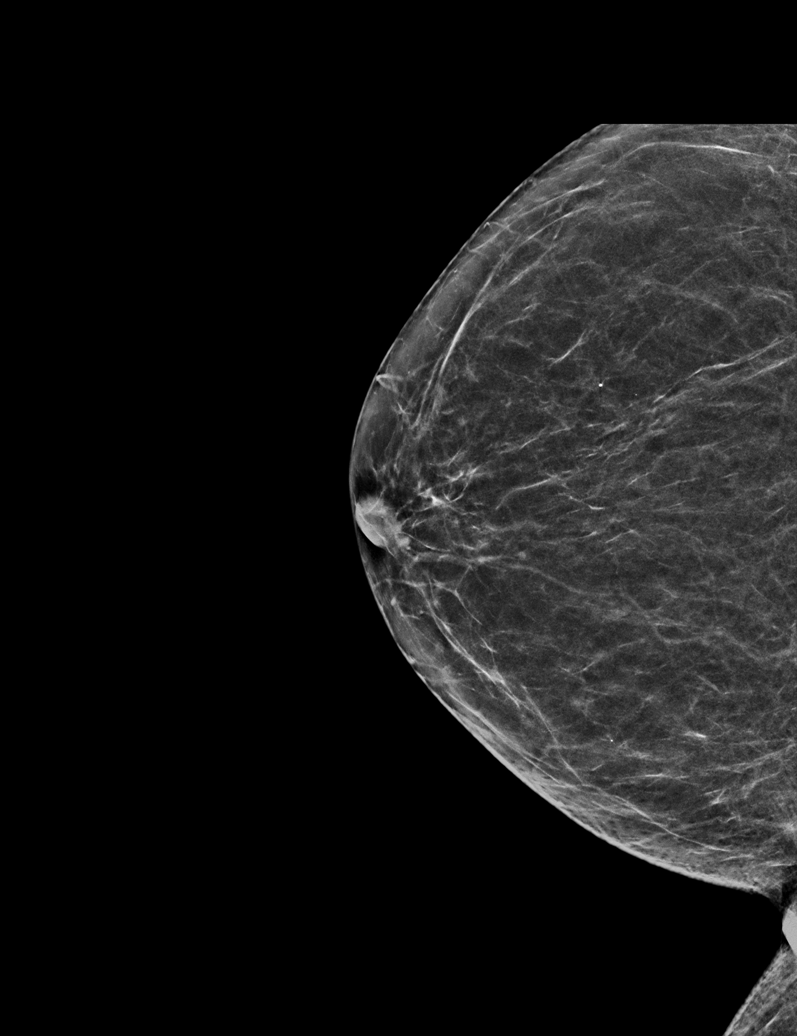

[L MLO synth-2D (2 of 2)]
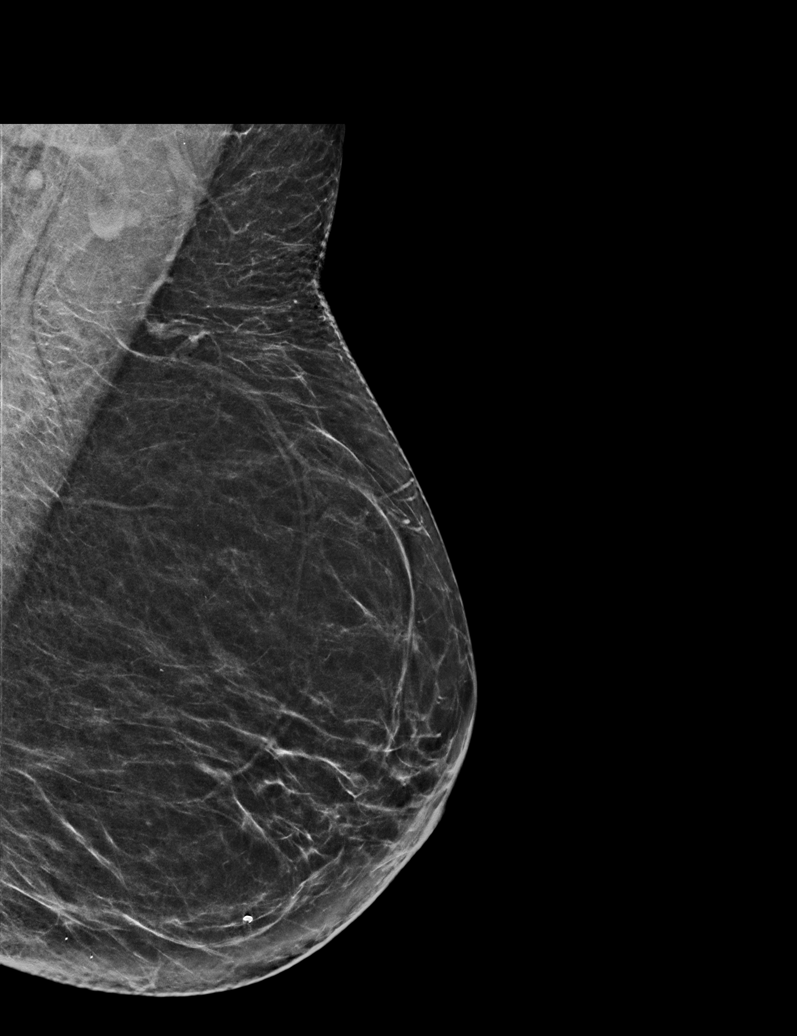

[L CC synth-2D]
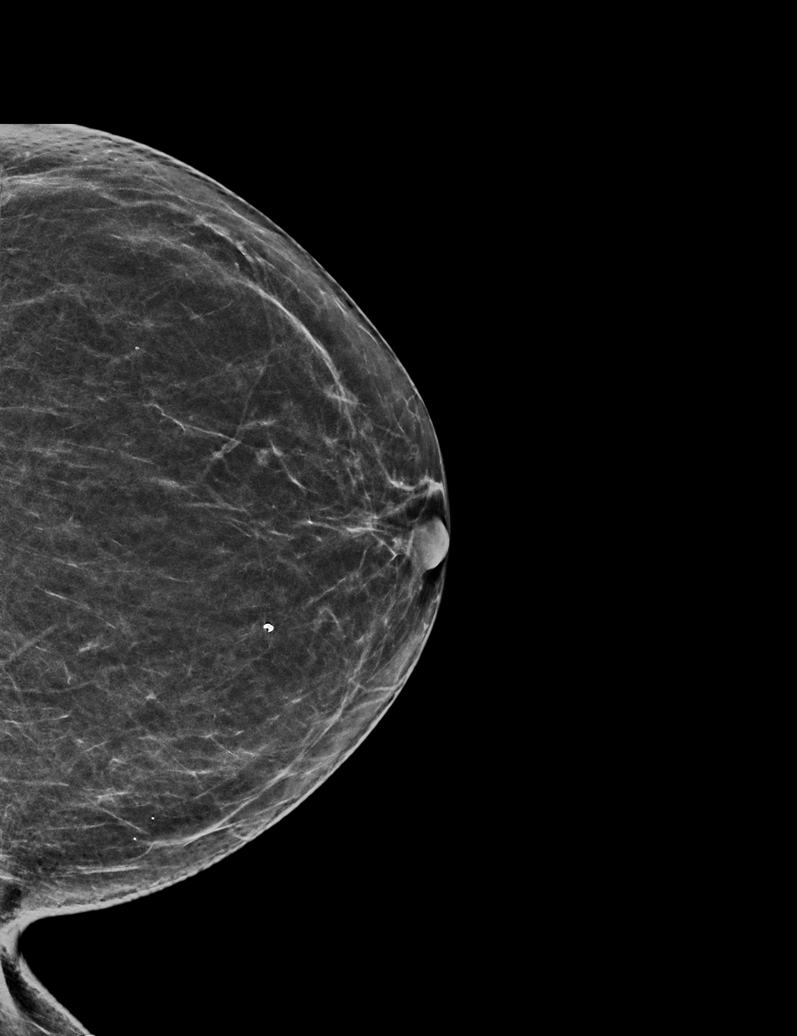

[L MLO tomo · tomo slice 29/56.0]
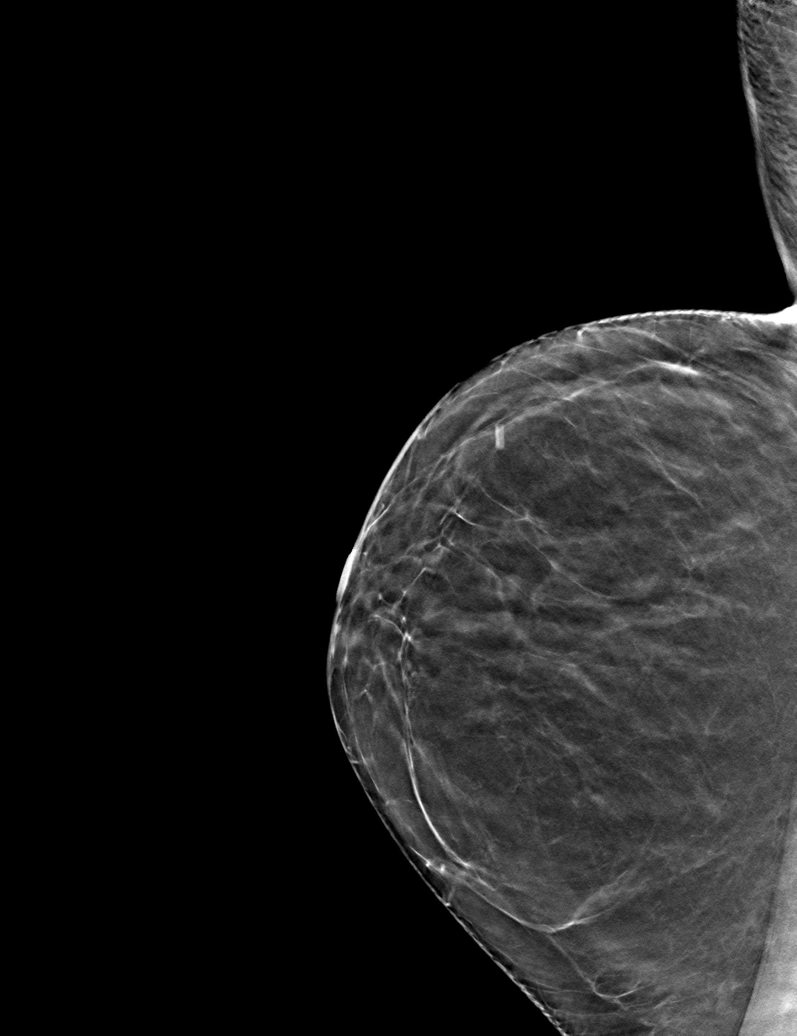

[6 of 30 positions shown; findings below may reference images not displayed]

ACR Breast Density Category b: There are scattered areas of
fibroglandular density.
FINDINGS: There are no findings suspicious for malignancy. The images were
evaluated with computer-aided detection.
IMPRESSION: No mammographic evidence of malignancy. A result letter of this
screening mammogram will be mailed directly to the patient.

RECOMMENDATION:
Screening mammogram in one year. (Code:WJ-I-BG6)

BI-RADS CATEGORY  1: Negative.

## 2024-03-26 ENCOUNTER — Other Ambulatory Visit: Payer: Self-pay | Admitting: Orthopedic Surgery

## 2024-03-26 DIAGNOSIS — M12812 Other specific arthropathies, not elsewhere classified, left shoulder: Secondary | ICD-10-CM

## 2024-03-26 DIAGNOSIS — M19012 Primary osteoarthritis, left shoulder: Secondary | ICD-10-CM

## 2024-03-31 ENCOUNTER — Ambulatory Visit
Admission: RE | Admit: 2024-03-31 | Discharge: 2024-03-31 | Disposition: A | Discharge status: Home / Self Care | Source: Ambulatory Visit | Attending: Orthopedic Surgery | Admitting: Orthopedic Surgery

## 2024-03-31 DIAGNOSIS — M19012 Primary osteoarthritis, left shoulder: Secondary | ICD-10-CM | POA: Diagnosis present

## 2024-03-31 DIAGNOSIS — M12812 Other specific arthropathies, not elsewhere classified, left shoulder: Secondary | ICD-10-CM | POA: Diagnosis present
